# Patient Record
Sex: Female | Born: 1990 | Race: White | Hispanic: No | Marital: Married | Smoking: Never smoker
Health system: Southern US, Community
[De-identification: ages and names within clinical notes are randomized; demographics above are authoritative.]

## PROBLEM LIST (undated history)

## (undated) ENCOUNTER — Inpatient Hospital Stay (HOSPITAL_COMMUNITY): Payer: Self-pay

## (undated) DIAGNOSIS — R519 Headache, unspecified: Secondary | ICD-10-CM

## (undated) DIAGNOSIS — R51 Headache: Secondary | ICD-10-CM

## (undated) DIAGNOSIS — I499 Cardiac arrhythmia, unspecified: Secondary | ICD-10-CM

## (undated) DIAGNOSIS — G510 Bell's palsy: Secondary | ICD-10-CM

## (undated) DIAGNOSIS — F419 Anxiety disorder, unspecified: Secondary | ICD-10-CM

## (undated) DIAGNOSIS — I341 Nonrheumatic mitral (valve) prolapse: Secondary | ICD-10-CM

## (undated) DIAGNOSIS — O139 Gestational [pregnancy-induced] hypertension without significant proteinuria, unspecified trimester: Secondary | ICD-10-CM

## (undated) HISTORY — PX: CHOLECYSTECTOMY: SHX55

## (undated) HISTORY — DX: Bell's palsy: G51.0

## (undated) HISTORY — PX: LAPAROSCOPIC GASTRIC SLEEVE RESECTION: SHX5895

## (undated) HISTORY — PX: TONSILLECTOMY AND ADENOIDECTOMY: SUR1326

## (undated) HISTORY — DX: Anxiety disorder, unspecified: F41.9

## (undated) HISTORY — DX: Nonrheumatic mitral (valve) prolapse: I34.1

## (undated) NOTE — *Deleted (*Deleted)
We will continue Ajovy and Nurtec.   Migraine with aura: There is increased risk for stroke in women with migraine with aura and a contraindication for the combined contraceptive pill for use by women who have migraine with aura. The risk for women with migraine without aura is lower. However other risk factors like smoking are far more likely to increase stroke risk than migraine. There is a recommendation for no smoking and for the use of OCPs without estrogen such as progestogen only pills particularly for women with migraine with aura.Marland Kitchen People who have migraine headaches with auras may be 3 times more likely to have a stroke caused by a blood clot, compared to migraine patients who don't see auras. Women who take hormone-replacement therapy may be 30 percent more likely to suffer a clot-based stroke than women not taking medication containing estrogen. Other risk factors like smoking and high blood pressure may be  much more important.   Magnesium: may help with headaches are aura, the best evidence for magnesium is for migraine with aura is its thought to stop the cortical spreading depression we believe is the pathophysiology of migraine aura.  To prevent or relieve headaches, try the following:  Cool Compress. Lie down and place a cool compress on your head.   Avoid headache triggers. If certain foods or odors seem to have triggered your migraines in the past, avoid them. A headache diary might help you identify triggers.   Include physical activity in your daily routine. Try a daily walk or other moderate aerobic exercise.   Manage stress. Find healthy ways to cope with the stressors, such as delegating tasks on your to-do list.   Practice relaxation techniques. Try deep breathing, yoga, massage and visualization.   Eat regularly. Eating regularly scheduled meals and maintaining a healthy diet might help prevent headaches. Also, drink plenty of fluids.   Follow a regular sleep schedule.  Sleep deprivation might contribute to headaches  Consider biofeedback. With this mind-body technique, you learn to control certain bodily functions - such as muscle tension, heart rate and blood pressure - to prevent headaches or reduce headache pain.      Proceed to emergency room if you experience new or worsening symptoms or symptoms do not resolve, if you have new neurologic symptoms or if headache is severe, or for any concerning symptom.   Migraine Headache A migraine headache is a very strong throbbing pain on one side or both sides of your head. This type of headache can also cause other symptoms. It can last from 4 hours to 3 days. Talk with your doctor about what things may bring on (trigger) this condition. What are the causes? The exact cause of this condition is not known. This condition may be triggered or caused by:  Drinking alcohol.  Smoking.  Taking medicines, such as: ? Medicine used to treat chest pain (nitroglycerin). ? Birth control pills. ? Estrogen. ? Some blood pressure medicines.  Eating or drinking certain products.  Doing physical activity. Other things that may trigger a migraine headache include:  Having a menstrual period.  Pregnancy.  Hunger.  Stress.  Not getting enough sleep or getting too much sleep.  Weather changes.  Tiredness (fatigue). What increases the risk?  Being 29-89 years old.  Being female.  Having a family history of migraine headaches.  Being Caucasian.  Having depression or anxiety.  Being very overweight. What are the signs or symptoms?  A throbbing pain. This pain may: ? Happen in  any area of the head, such as on one side or both sides. ? Make it hard to do daily activities. ? Get worse with physical activity. ? Get worse around bright lights or loud noises.  Other symptoms may include: ? Feeling sick to your stomach (nauseous). ? Vomiting. ? Dizziness. ? Being sensitive to bright lights, loud  noises, or smells.  Before you get a migraine headache, you may get warning signs (an aura). An aura may include: ? Seeing flashing lights or having blind spots. ? Seeing bright spots, halos, or zigzag lines. ? Having tunnel vision or blurred vision. ? Having numbness or a tingling feeling. ? Having trouble talking. ? Having weak muscles.  Some people have symptoms after a migraine headache (postdromal phase), such as: ? Tiredness. ? Trouble thinking (concentrating). How is this treated?  Taking medicines that: ? Relieve pain. ? Relieve the feeling of being sick to your stomach. ? Prevent migraine headaches.  Treatment may also include: ? Having acupuncture. ? Avoiding foods that bring on migraine headaches. ? Learning ways to control your body functions (biofeedback). ? Therapy to help you know and deal with negative thoughts (cognitive behavioral therapy). Follow these instructions at home: Medicines  Take over-the-counter and prescription medicines only as told by your doctor.  Ask your doctor if the medicine prescribed to you: ? Requires you to avoid driving or using heavy machinery. ? Can cause trouble pooping (constipation). You may need to take these steps to prevent or treat trouble pooping:  Drink enough fluid to keep your pee (urine) pale yellow.  Take over-the-counter or prescription medicines.  Eat foods that are high in fiber. These include beans, whole grains, and fresh fruits and vegetables.  Limit foods that are high in fat and sugar. These include fried or sweet foods. Lifestyle  Do not drink alcohol.  Do not use any products that contain nicotine or tobacco, such as cigarettes, e-cigarettes, and chewing tobacco. If you need help quitting, ask your doctor.  Get at least 8 hours of sleep every night.  Limit and deal with stress. General instructions      Keep a journal to find out what may bring on your migraine headaches. For example, write  down: ? What you eat and drink. ? How much sleep you get. ? Any change in what you eat or drink. ? Any change in your medicines.  If you have a migraine headache: ? Avoid things that make your symptoms worse, such as bright lights. ? It may help to lie down in a dark, quiet room. ? Do not drive or use heavy machinery. ? Ask your doctor what activities are safe for you.  Keep all follow-up visits as told by your doctor. This is important. Contact a doctor if:  You get a migraine headache that is different or worse than others you have had.  You have more than 15 headache days in one month. Get help right away if:  Your migraine headache gets very bad.  Your migraine headache lasts longer than 72 hours.  You have a fever.  You have a stiff neck.  You have trouble seeing.  Your muscles feel weak or like you cannot control them.  You start to lose your balance a lot.  You start to have trouble walking.  You pass out (faint).  You have a seizure. Summary  A migraine headache is a very strong throbbing pain on one side or both sides of your head. These headaches can  also cause other symptoms.  This condition may be treated with medicines and changes to your lifestyle.  Keep a journal to find out what may bring on your migraine headaches.  Contact a doctor if you get a migraine headache that is different or worse than others you have had.  Contact your doctor if you have more than 15 headache days in a month. This information is not intended to replace advice given to you by your health care provider. Make sure you discuss any questions you have with your health care provider. Document Revised: 01/20/2019 Document Reviewed: 11/10/2018 Elsevier Patient Education  2020 [employer].

---

## 2012-10-12 HISTORY — PX: WISDOM TOOTH EXTRACTION: SHX21

## 2015-03-28 LAB — OB RESULTS CONSOLE HEPATITIS B SURFACE ANTIGEN: HEP B S AG: NEGATIVE

## 2015-03-28 LAB — OB RESULTS CONSOLE ANTIBODY SCREEN: Antibody Screen: NEGATIVE

## 2015-03-28 LAB — OB RESULTS CONSOLE RUBELLA ANTIBODY, IGM: Rubella: IMMUNE

## 2015-03-28 LAB — OB RESULTS CONSOLE RPR: RPR: NONREACTIVE

## 2015-03-28 LAB — OB RESULTS CONSOLE ABO/RH: RH TYPE: POSITIVE

## 2015-03-28 LAB — OB RESULTS CONSOLE HIV ANTIBODY (ROUTINE TESTING): HIV: NONREACTIVE

## 2015-03-28 LAB — OB RESULTS CONSOLE GC/CHLAMYDIA
Chlamydia: NEGATIVE
Gonorrhea: NEGATIVE

## 2015-05-14 ENCOUNTER — Ambulatory Visit: Payer: Self-pay | Admitting: Cardiovascular Disease

## 2015-05-20 ENCOUNTER — Telehealth: Payer: Self-pay | Admitting: Cardiovascular Disease

## 2015-05-20 NOTE — Telephone Encounter (Signed)
Received records from [facility] for appointment on 05/24/15 with Dr Duke Salvia.  Records given to [facility] (medical records) for Dr Leonides Sake schedule on 05/24/15.  lp

## 2015-05-23 NOTE — Progress Notes (Signed)
Cardiology Office Note   Date:  05/24/2015   ID:  Sarah Gibson, DOB 03-25-1991, MRN [id number]  PCP:  Sarah Aden, MD  Cardiologist:   Sarah Hook, MD   Chief Complaint  Patient presents with  . New Evaluation    16 weeks and 2 days pregnant- DR Sarah Gibson, HISTORY MVP  . Hypertension    HISTORY OF HTN  . Chest Pain    TIGHTNESS ,AT REST AND ACTIVITY  . Palpitations    RANDOM      History of Present Illness: Sarah Gibson is a 24 y.o. female 75 weeks G1P0 with mitral valve prolapse who presents for an evaluation of palpitations.  Sarah Gibson i reports one month of intermittent heart racing. These episodes occur daily to every other day and multiple times throughout the day. These palpitations are associated with chest pain that shoots through her heart. Prior to being pregnant she had these episodes once a week to once per month. In the past they lasted no longer than 10 seconds, however these episodes are lasting up to 1 minute. It is associated with shortness of breath. She has nausea and general but doesn't think it's any worse with these episodes. She also denies lightheadedness or dizziness as well as diaphoresis. She drinks 32 ounces of sweet tea daily.   Sarah Gibson also reports episodes of high blood pressure since becoming pregnant. 2 weeks ago she took her blood pressure at home and it was 150/94. At the time she felt disoriented. She followed up with her OB/GYN and it was approximately 140/90. Treatment was not initiated at that time. She did undergo 24-hour urine and glucose testing that were reportedly within normal limits.  Sarah Gibson also reports generalized fatigue, dizziness not associated with her palpitations, difficulty forming thoughts and decreased concentration. She denies orthopnea or PND but has noted some mild lower extremity edema.  Past Medical History  Diagnosis Date  . Bell's palsy     AT AGE 33 YEARS  . MVP (mitral valve  prolapse)   . Anxiety     Past Surgical History  Procedure Laterality Date  . Wisdom tooth extraction  2014  . Tonsillectomy and adenoidectomy      AT AGE 49 YEARS     Current Outpatient Prescriptions  Medication Sig Dispense Refill  . butalbital-acetaminophen-caffeine (FIORICET, ESGIC) 50-325-40 MG per tablet   0  . Prenatal Vit-Fe Fumarate-FA (PRENATAL MULTIVITAMIN) TABS tablet 1 TO 2 TABLETS A DAY     No current facility-administered medications for this visit.    Allergies:   Augmentin    Social History:  The patient  reports that she has never smoked. She does not have any smokeless tobacco history on file. She reports that she does not drink alcohol or use illicit drugs.   Family History:  The patient's family history includes Diabetes in her mother; Hypertension in her father and mother.    ROS:  Please see the history of present illness.   Otherwise, review of systems are positive for nasuea, fatigue.   All other systems are reviewed and negative.    PHYSICAL EXAM: VS:  BP 130/80 mmHg  Pulse 100  Ht  (1.702 m)  Wt 137.349 kg (302 lb 12.8 oz)  BMI 47.41 kg/m2 , BMI Body mass index is 47.41 kg/(m^2). GENERAL:  Well appearing HEENT:  Pupils equal round and reactive, fundi not visualized, oral mucosa unremarkable NECK:  No jugular venous distention, waveform within normal limits,  carotid upstroke brisk and symmetric, no bruits, no thyromegaly LYMPHATICS:  No cervical adenopathy LUNGS:  Clear to auscultation bilaterally HEART:  RRR.  PMI not displaced or sustained,S1 within normal limits, prominent S2 no S3, no S4, no clicks, no rubs, no murmurs ABD:  Flat, positive bowel sounds normal in frequency in pitch, no bruits, no rebound, no guarding, no midline pulsatile mass, no hepatomegaly, no splenomegaly EXT:  2 plus pulses throughout, no edema, no cyanosis no clubbing SKIN:  No rashes no nodules NEURO:  Cranial nerves II through XII grossly intact, motor grossly  intact throughout PSYCH:  Cognitively intact, oriented to person place and time    EKG:  EKG is ordered today. The ekg ordered today demonstrates sinus rhythm at 100 bpm.   Recent Labs: No results found for requested labs within last 365 days.    Lipid Panel No results found for: CHOL, TRIG, HDL, CHOLHDL, VLDL, LDLCALC, LDLDIRECT    Wt Readings from Last 3 Encounters:  05/24/15 137.349 kg (302 lb 12.8 oz)      Other studies Reviewed:Additional studies/ records that were reviewed today include: medical record. Review of the above records demonstrates:  Please see elsewhere in the note.     ASSESSMENT AND PLAN:  # Mitral valve prolapse: I do not appreciate a murmur or click of mitral valve prolapse on her exam.  Although she has symptoms such as SOB that could be from MVP and mitral regurgitation, she does not appear to be in heart failure on exam.  She certainly could be having palpitations that contribute to her symptoms. We will obtain a baseline echo today to assess for the severity of her MVP and determine if there are any regurgitant lesions.  We will also obtain a Holter monitor to determine if there are any arrhythmias contributing to her symptoms.  We recommended that she limit her caffeine intake, as this can contribute to her symptoms. - TTE - 48H Holter  #  Hypertension: BP has been borderline elevated with her OB appointments.  It is not high enough to require treatment at this time but should certainly be watched, as I would expect her BP to be lower at this phase of pregnancy. - continue to monitor for now.  BP wnl today and was reportedly normal by the time she followed up with her OB/GYN.  Current medicines are reviewed at length with the patient today.  The patient does not have concerns regarding medicines.  The following changes have been made:  no change  Labs/ tests ordered today include: holter monitor, echo  Orders Placed This Encounter  Procedures  .  Holter monitor - 48 hour  . EKG 12-Lead  . ECHOCARDIOGRAM COMPLETE     Disposition:   FU with Dr. Elmarie Shiley C. Duke Gibson in 2 months   Signed, Sarah Hook, MD  05/24/2015 5:22 PM    [facility]

## 2015-05-24 ENCOUNTER — Encounter: Payer: Self-pay | Admitting: Cardiovascular Disease

## 2015-05-24 ENCOUNTER — Encounter (INDEPENDENT_AMBULATORY_CARE_PROVIDER_SITE_OTHER): Payer: BLUE CROSS/BLUE SHIELD

## 2015-05-24 ENCOUNTER — Ambulatory Visit (INDEPENDENT_AMBULATORY_CARE_PROVIDER_SITE_OTHER): Payer: BLUE CROSS/BLUE SHIELD | Admitting: Cardiovascular Disease

## 2015-05-24 VITALS — BP 130/80 | HR 100 | Ht 67.0 in | Wt 302.8 lb

## 2015-05-24 DIAGNOSIS — I341 Nonrheumatic mitral (valve) prolapse: Secondary | ICD-10-CM

## 2015-05-24 DIAGNOSIS — Z331 Pregnant state, incidental: Secondary | ICD-10-CM | POA: Diagnosis not present

## 2015-05-24 DIAGNOSIS — Z3490 Encounter for supervision of normal pregnancy, unspecified, unspecified trimester: Secondary | ICD-10-CM

## 2015-05-24 DIAGNOSIS — R002 Palpitations: Secondary | ICD-10-CM

## 2015-05-24 NOTE — Patient Instructions (Signed)
Your physician has recommended that you wear a holter monitor FOR 48 HOURS. Holter monitors are medical devices that record the heart's electrical activity. Doctors most often use these monitors to diagnose arrhythmias. Arrhythmias are problems with the speed or rhythm of the heartbeat. The monitor is a small, portable device. You can wear one while you do your normal daily activities. This is usually used to diagnose what is causing palpitations/syncope (passing out).  Your physician has requested that you have an echocardiogram AT [address]. Echocardiography is a painless test that uses sound waves to create images of your heart. It provides your doctor with information about the size and shape of your heart and how well your heart's chambers and valves are working. This procedure takes approximately one hour. There are no restrictions for this procedure.   Your physician recommends that you schedule a follow-up appointment in 2 MONTHS DR [place].

## 2015-05-29 ENCOUNTER — Ambulatory Visit (HOSPITAL_COMMUNITY): Payer: BLUE CROSS/BLUE SHIELD | Attending: Cardiovascular Disease

## 2015-05-29 ENCOUNTER — Other Ambulatory Visit: Payer: Self-pay

## 2015-05-29 DIAGNOSIS — Z8249 Family history of ischemic heart disease and other diseases of the circulatory system: Secondary | ICD-10-CM | POA: Insufficient documentation

## 2015-05-29 DIAGNOSIS — I341 Nonrheumatic mitral (valve) prolapse: Secondary | ICD-10-CM

## 2015-05-29 DIAGNOSIS — I10 Essential (primary) hypertension: Secondary | ICD-10-CM | POA: Diagnosis not present

## 2015-05-29 DIAGNOSIS — Z3490 Encounter for supervision of normal pregnancy, unspecified, unspecified trimester: Secondary | ICD-10-CM

## 2015-05-29 DIAGNOSIS — Z331 Pregnant state, incidental: Secondary | ICD-10-CM

## 2015-05-29 DIAGNOSIS — R002 Palpitations: Secondary | ICD-10-CM

## 2015-06-03 ENCOUNTER — Telehealth: Payer: Self-pay | Admitting: *Deleted

## 2015-06-03 MED ORDER — LABETALOL HCL 100 MG PO TABS: 100.0000 mg | ORAL_TABLET | Freq: Two times a day (BID) | ORAL | Status: DC

## 2015-06-03 NOTE — Telephone Encounter (Signed)
pt aware of results  She does want to start labetalol New script sent to the pharmacy

## 2015-06-03 NOTE — Telephone Encounter (Signed)
-----   Message from Chilton Si, MD sent at 06/01/2015 11:34 AM EDT ----- Holter showed early beats from both the top and bottom chambers of the heart.  It also showed some skipped beats.  None of the findings are dangerous but could certainly be causing her palpitations.  If she wants to take something to limit the palpitations I can prescribe labetalol  bid, which will help the palpitations and lower her BP, which has been borderline.  This medication is safe in pregnancy.  Otherwise, if the symptoms are not very bothersome to her, she can continue without taking any medication.

## 2015-06-04 NOTE — Telephone Encounter (Signed)
ROUTED INFORMATION TO DR Billy Coast

## 2015-07-23 NOTE — Progress Notes (Addendum)
Cardiology Office Note   Date:  07/24/2015   ID:  Sarah Gibson, DOB October 14, 1990, MRN [id number]  PCP:  Lenoard Aden, MD  Cardiologist:   Madilyn Hook, MD   Chief Complaint  Patient presents with  . New Evaluation    SOB on minimal exertion//slight swelling in feet and ankles      History of Present Illness: Sarah Gibson is a 24 y.o. female 16 weeks G1P0 with mitral valve prolapse who presents for follow up on palpitations.  She was seen in clinic on 05/23/15 and reported an increase in palpitations.  She was referred for echo and 48 hour Holter to evaluate her report of MVP and palpitations.  Her echo was normal and showed no evidence of mitral valve prolapse or regurgitation.  Holter revealed occasional PACs and PVCs, but no sustained arrhythmias.  Ms. Strand is now [redacted] weeks pregnant and knows that she is expecting a baby girl.  She has been doing well.  For the two weeks after her appointment she noted less frequent but more intense palpitations.  Since then it has been very rare.  She notes shortness of breath when she moves quickly.  She notes orthopnea but denies PND.  She also notes mild edema that has improved since she changed chairs at work.   Past Medical History  Diagnosis Date  . Bell's palsy     AT AGE 75 YEARS  . MVP (mitral valve prolapse)   . Anxiety     Past Surgical History  Procedure Laterality Date  . Wisdom tooth extraction  2014  . Tonsillectomy and adenoidectomy      AT AGE 75 YEARS     Current Outpatient Prescriptions  Medication Sig Dispense Refill  . butalbital-acetaminophen-caffeine (FIORICET, ESGIC) 50-325-40 MG per tablet   0  . labetalol (NORMODYNE) 100 MG tablet Take 1 tablet (100 mg total) by mouth 2 (two) times daily. 60 tablet 12  . Prenatal Vit-Fe Fumarate-FA (PRENATAL MULTIVITAMIN) TABS tablet 1 TO 2 TABLETS A DAY     No current facility-administered medications for this visit.    Allergies:   Augmentin    Social  History:  The patient  reports that she has never smoked. She does not have any smokeless tobacco history on file. She reports that she does not drink alcohol or use illicit drugs.   Family History:  The patient's family history includes Diabetes in her mother; Hypertension in her father and mother.    ROS:  Please see the history of present illness.   Otherwise, review of systems are positive for nasuea, fatigue.   All other systems are reviewed and negative.    PHYSICAL EXAM: VS:  BP 130/82 mmHg  Pulse 80  Ht  (1.702 m)  Wt 137.667 kg (303 lb 8 oz)  BMI 47.52 kg/m2 , BMI Body mass index is 47.52 kg/(m^2). GENERAL:  Well appearing HEENT:  Pupils equal round and reactive, fundi not visualized, oral mucosa unremarkable NECK:  No jugular venous distention, waveform within normal limits, carotid upstroke brisk and symmetric, no bruits, no thyromegaly LYMPHATICS:  No cervical adenopathy LUNGS:  Clear to auscultation bilaterally HEART:  RRR.  PMI not displaced or sustained,S1 within normal limits, prominent S2 no S3, no S4, no clicks, no rubs, no murmurs ABD:  Flat, positive bowel sounds normal in frequency in pitch, no bruits, no rebound, no guarding, no midline pulsatile mass, no hepatomegaly, no splenomegaly EXT:  2 plus pulses throughout, no edema, no  cyanosis no clubbing SKIN:  No rashes no nodules NEURO:  Cranial nerves II through XII grossly intact, motor grossly intact throughout PSYCH:  Cognitively intact, oriented to person place and time    EKG:  EKG is not ordered today.  Echo 05/29/15: Study Conclusions  - Left ventricle: The cavity size was normal. Systolic function was normal. The estimated ejection fraction was in the range of 55% to 60%. Wall motion was normal; there were no regional wall motion abnormalities. Left ventricular diastolic function parameters were normal.  48 Hour Holter Monitor 05/24/15  Quality: Fair. Baseline artifact. Predominant  rhythm: sinus Average heart rate: 99 bpm Max heart rate: 167 bpm Min heart rate: 60 bpm Pauses >2.5 seconds: 0 Ventricular ectopics: 10 (10 isolated, 0 bigemeny, 0 trigemeny, 0 quadrigeminy events)  Percentage of ectopic beats: <0.01% Morphology: monomorphic Supraventricular ectopics: 5 3 episodes of Mobitz I Patient did not submit a symptom diary  Recent Labs: No results found for requested labs within last 365 days.   03/28/15: WBC 12.6, Hgb 13.3, Hct 38.6, Plt 341 Na 137, K 3.8, BUN 5,Creatinine 0.56, AST 20, ALT 22 Hgb A1c 5.5  Lipid Panel No results found for: CHOL, TRIG, HDL, CHOLHDL, VLDL, LDLCALC, LDLDIRECT    Wt Readings from Last 3 Encounters:  07/24/15 137.667 kg (303 lb 8 oz)  05/24/15 137.349 kg (302 lb 12.8 oz)      Other studies Reviewed:Additional studies/ records that were reviewed today include: medical record. Review of the above records demonstrates:  Please see elsewhere in the note.     ASSESSMENT AND PLAN:  # Mitral valve prolapse: Not seen on echo  # Palpitations: Holter monitor revealed occasional PVCs and PACs that likely account for her symptoms.  There were no malignant arrhythmias and her symptoms have improved.  #  Hypertension: BP has been borderline elevated with her OB appointments.  Well-controlled on labetalol.  Current medicines are reviewed at length with the patient today.  The patient does not have concerns regarding medicines.  The following changes have been made:  no change  Labs/ tests ordered today include:  No orders of the defined types were placed in this encounter.     Disposition:   FU with Dr. Elmarie Shiley C. Duke Salvia as needed  Mikael Spray, MD  07/24/2015 3:50 PM    Sarah Gibson

## 2015-07-24 ENCOUNTER — Ambulatory Visit (INDEPENDENT_AMBULATORY_CARE_PROVIDER_SITE_OTHER): Payer: BLUE CROSS/BLUE SHIELD | Admitting: Cardiovascular Disease

## 2015-07-24 VITALS — BP 130/82 | HR 80 | Ht 67.0 in | Wt 303.5 lb

## 2015-07-24 DIAGNOSIS — I10 Essential (primary) hypertension: Secondary | ICD-10-CM | POA: Diagnosis not present

## 2015-07-24 DIAGNOSIS — I493 Ventricular premature depolarization: Secondary | ICD-10-CM | POA: Diagnosis not present

## 2015-07-24 DIAGNOSIS — I491 Atrial premature depolarization: Secondary | ICD-10-CM

## 2015-07-24 NOTE — Patient Instructions (Signed)
Your physician recommends that you schedule a follow-up appointment only if needed.

## 2015-07-25 ENCOUNTER — Encounter: Payer: Self-pay | Admitting: Cardiovascular Disease

## 2015-08-14 ENCOUNTER — Encounter: Payer: Self-pay | Admitting: Cardiovascular Disease

## 2015-10-02 LAB — OB RESULTS CONSOLE GBS: STREP GROUP B AG: NEGATIVE

## 2015-10-28 ENCOUNTER — Other Ambulatory Visit: Payer: Self-pay | Admitting: Obstetrics and Gynecology

## 2015-10-29 ENCOUNTER — Inpatient Hospital Stay (HOSPITAL_COMMUNITY)
Admission: AD | Admit: 2015-10-29 | Discharge: 2015-10-30 | Disposition: A | Discharge status: Home / Self Care | Payer: BLUE CROSS/BLUE SHIELD | Source: Ambulatory Visit | Attending: Obstetrics | Admitting: Obstetrics

## 2015-10-29 ENCOUNTER — Encounter (HOSPITAL_COMMUNITY): Payer: Self-pay | Admitting: *Deleted

## 2015-10-29 DIAGNOSIS — O26893 Other specified pregnancy related conditions, third trimester: Secondary | ICD-10-CM | POA: Insufficient documentation

## 2015-10-29 DIAGNOSIS — Z3A38 38 weeks gestation of pregnancy: Secondary | ICD-10-CM | POA: Diagnosis not present

## 2015-10-29 DIAGNOSIS — G44209 Tension-type headache, unspecified, not intractable: Secondary | ICD-10-CM | POA: Diagnosis not present

## 2015-10-29 DIAGNOSIS — O163 Unspecified maternal hypertension, third trimester: Secondary | ICD-10-CM | POA: Insufficient documentation

## 2015-10-29 HISTORY — DX: Cardiac arrhythmia, unspecified: I49.9

## 2015-10-29 HISTORY — DX: Gestational (pregnancy-induced) hypertension without significant proteinuria, unspecified trimester: O13.9

## 2015-10-29 LAB — COMPREHENSIVE METABOLIC PANEL
ALT: 33 U/L (ref 14–54)
AST: 33 U/L (ref 15–41)
Albumin: 2.9 g/dL — ABNORMAL LOW (ref 3.5–5.0)
Alkaline Phosphatase: 114 U/L (ref 38–126)
Anion gap: 8 (ref 5–15)
BUN: 9 mg/dL (ref 6–20)
CO2: 23 mmol/L (ref 22–32)
Calcium: 9.7 mg/dL (ref 8.9–10.3)
Chloride: 106 mmol/L (ref 101–111)
Creatinine, Ser: 0.54 mg/dL (ref 0.44–1.00)
GFR calc Af Amer: >60 mL/min (ref >60)
GFR calc non Af Amer: >60 mL/min (ref >60)
Glucose, Bld: 114 mg/dL — ABNORMAL HIGH (ref 65–99)
Potassium: 4.2 mmol/L (ref 3.5–5.1)
Sodium: 137 mmol/L (ref 135–145)
Total Bilirubin: 0.5 mg/dL (ref 0.3–1.2)
Total Protein: 6.6 g/dL (ref 6.5–8.1)

## 2015-10-29 LAB — URINALYSIS, ROUTINE W REFLEX MICROSCOPIC
Bilirubin Urine: NEGATIVE
Glucose, UA: NEGATIVE mg/dL
Hgb urine dipstick: NEGATIVE
Ketones, ur: NEGATIVE mg/dL
Leukocytes, UA: NEGATIVE
Nitrite: NEGATIVE
Protein, ur: NEGATIVE mg/dL
Specific Gravity, Urine: 1.025 (ref 1.005–1.030)
pH: 6 (ref 5.0–8.0)

## 2015-10-29 LAB — CBC
HCT: 37.5 % (ref 36.0–46.0)
Hemoglobin: 13.5 g/dL (ref 12.0–15.0)
MCH: 29.8 pg (ref 26.0–34.0)
MCHC: 36 g/dL (ref 30.0–36.0)
MCV: 82.8 fL (ref 78.0–100.0)
Platelets: 259 10*3/uL (ref 150–400)
RBC: 4.53 MIL/uL (ref 3.87–5.11)
RDW: 13.4 % (ref 11.5–15.5)
WBC: 12.3 10*3/uL — ABNORMAL HIGH (ref 4.0–10.5)

## 2015-10-29 MED ORDER — BUTALBITAL-APAP-CAFFEINE 50-325-40 MG PO TABS
1.0000 | ORAL_TABLET | Freq: Once | ORAL | Status: AC
  Administered 2015-10-29: 1 via ORAL
  Filled 2015-10-29: qty 1

## 2015-10-29 NOTE — MAU Provider Note (Signed)
History     CSN: [id number]  Arrival date and time: 10/29/15 2153   No chief complaint on file.  HPI  Reports headache all day - tension-like in front of head and neck feels tight Elevated BP - states "high" when she went to local fire-station to get BP check  Past Medical History  Diagnosis Date  . Bell's palsy     AT AGE 25 YEARS  . MVP (mitral valve prolapse)   . Anxiety   . Pregnancy induced hypertension   . Arrhythmia     Past Surgical History  Procedure Laterality Date  . Wisdom tooth extraction  2014  . Tonsillectomy and adenoidectomy      AT AGE 78 YEARS    Family History  Problem Relation Age of Onset  . Hypertension Mother   . Diabetes Mother   . Hypertension Father     Social History  Substance Use Topics  . Smoking status: Never Smoker   . Smokeless tobacco: None  . Alcohol Use: No    Allergies:  Allergies  Allergen Reactions  . Augmentin [Amoxicillin-Pot Clavulanate] Nausea Only    Prescriptions prior to admission  Medication Sig Dispense Refill Last Dose  . acetaminophen (TYLENOL) 500 MG tablet Take 500 mg by mouth every 6 (six) hours as needed.   10/29/2015 at 1800  . labetalol (NORMODYNE) 100 MG tablet Take 1 tablet (100 mg total) by mouth 2 (two) times daily. 60 tablet 12 10/29/2015 at 2100  . Prenatal Vit-Fe Fumarate-FA (PRENATAL MULTIVITAMIN) TABS tablet 1 TO 2 TABLETS A DAY   10/28/2015 at Unknown time  . ranitidine (ZANTAC) 150 MG tablet Take 150 mg by mouth 2 (two) times daily.   10/29/2015 at Unknown time  . butalbital-acetaminophen-caffeine (FIORICET, ESGIC) 50-325-40 MG per tablet   0 Taking    ROS  Headache No nausea or vomiting / no epigastric pain No blurred vision  Physical Exam   Blood pressure 133/86, pulse 92, temperature 98.2 F (36.8 C), temperature source Oral, resp. rate 20, height  (1.676 m), weight 139.424 kg (307 lb 6 oz), SpO2 98 %.   BP: 126/79 - 133/86 - 127/83  Physical Exam  Alert and  oriented Abdomen soft / gravid with non-tender uterus Extremities trace pedal edema  Results for Sarah, Gibson (MRN [id number]) as of 10/29/2015 23:46  Ref. Range 10/29/2015 20:08 10/29/2015 22:49  WBC Latest Ref Range: 4.0-10.5 K/uL  12.3 (H)  RBC Latest Ref Range: 3.87-5.11 MIL/uL  4.53  Hemoglobin Latest Ref Range: 12.0-15.0 g/dL  95.2  HCT Latest Ref Range: 36.0-46.0 %  37.5  MCV Latest Ref Range: 78.0-100.0 fL  82.8  MCH Latest Ref Range: 26.0-34.0 pg  29.8  MCHC Latest Ref Range: 30.0-36.0 g/dL  84.1  RDW Latest Ref Range: 11.5-15.5 %  13.4  Platelets Latest Ref Range: 150-400 K/uL  259  Appearance Latest Ref Range: CLEAR  HAZY (A)   Bilirubin Urine Latest Ref Range: NEGATIVE  NEGATIVE   Color, Urine Latest Ref Range: YELLOW  YELLOW   Glucose Latest Ref Range: NEGATIVE mg/dL NEGATIVE   Hgb urine dipstick Latest Ref Range: NEGATIVE  NEGATIVE   Ketones, ur Latest Ref Range: NEGATIVE mg/dL NEGATIVE   Leukocytes, UA Latest Ref Range: NEGATIVE  NEGATIVE   Nitrite Latest Ref Range: NEGATIVE  NEGATIVE   pH Latest Ref Range: 5.0-8.0  6.0   Protein Latest Ref Range: NEGATIVE mg/dL NEGATIVE   Specific Gravity, Urine Latest Ref Range: 1.005-1.030  1.025  Ref Range 10:49 PM    Sodium 135 - 145 mmol/L 137   Potassium 3.5 - 5.1 mmol/L 4.2   Chloride 101 - 111 mmol/L 106   CO2 22 - 32 mmol/L 23   Glucose, Bld 65 - 99 mg/dL 161 (H)   BUN 6 - 20 mg/dL 9   Creatinine, Ser 0.96 - 1.00 mg/dL 0.45   Calcium 8.9 - 40.9 mg/dL 9.7   Total Protein 6.5 - 8.1 g/dL 6.6   Albumin 3.5 - 5.0 g/dL 2.9 (L)   AST 15 - 41 U/L 33   ALT 14 - 54 U/L 33   Alkaline Phosphatase 38 - 126 U/L 114   Total Bilirubin 0.3 - 1.2 mg/dL 0.5   GFR calc non Af Amer >60 mL/min >60   GFR calc Af Amer >60 mL/min >60          MAU Course  Procedures NST - baseline 135 / moderate variability / + accels / no decels  Assessment and Plan  38.6 weeks Headache Hypertension - no evidence of  pre-eclampsia  1) DC home 2) increase water intake 3) regular dose of Labetalol Q 12 hours - do not skip doses 4) BP check at home with only large cuff for accurate reading 5) PIH precautions - call office with symptoms  Sarah Gibson 10/29/2015, 11:46 PM

## 2015-10-29 NOTE — MAU Note (Addendum)
PT SAYS  SHE HAS HAD A TENSION H/A   ALL DAY   STARTED  TODAY  AT  1 PM-  TOOK TYLENOL   2  -5OO MG  TAB  AT  6PM.  -   NO RELIEF.      HAD  H/A ALSO ON    YESTERDAY -  SHE SLEPT  IT  OFF.        AT 845PM-  SHE  WENT  TO   FD  IN [place]   AND  HAD  BP  CHECKED -   147/110.   -   HAD NOT HAD HER EVENING   DOSE OF BP MED  YET       SHE    TOOK BP MED   AT 9PM.   .  H/A  STILL SAME  AND   FEELS  TENSION IN HER  NECK   .       IS  AN INDUCTION   ON  1-25      SHE  CALLED  TANYA  , CNM -  TOLD  TO COME  HERE.

## 2015-10-31 ENCOUNTER — Telehealth (HOSPITAL_COMMUNITY): Payer: Self-pay | Admitting: *Deleted

## 2015-10-31 ENCOUNTER — Encounter (HOSPITAL_COMMUNITY): Payer: Self-pay | Admitting: *Deleted

## 2015-10-31 NOTE — Telephone Encounter (Signed)
Preadmission screen  

## 2015-11-05 ENCOUNTER — Other Ambulatory Visit: Payer: Self-pay | Admitting: Obstetrics and Gynecology

## 2015-11-05 NOTE — H&P (Signed)
Sarah Gibson is a 25 y.o. female presenting for gestational HTN.  Maternal Medical History:  Reason for admission: Contractions.  Nausea.  Contractions: Frequency: rare.   Perceived severity is mild.    Fetal activity: Perceived fetal activity is normal.   Last perceived fetal movement was within the past hour.    Prenatal complications: PIH.   Prenatal Complications - Diabetes: none.    OB History    Gravida Para Term Preterm AB TAB SAB Ectopic Multiple Living   1              Past Medical History  Diagnosis Date  . Bell's palsy     AT AGE 89 YEARS  . MVP (mitral valve prolapse)   . Anxiety   . Pregnancy induced hypertension   . Arrhythmia    Past Surgical History  Procedure Laterality Date  . Wisdom tooth extraction  2014  . Tonsillectomy and adenoidectomy      AT AGE 15 YEARS   Family History: family history includes Depression in her mother; Diabetes in her mother; Hypertension in her father and mother; Migraines in her father. Social History:  reports that she has never smoked. She has never used smokeless tobacco. She reports that she does not drink alcohol or use illicit drugs.   Prenatal Transfer Tool  Maternal Diabetes: No Genetic Screening: Normal Maternal Ultrasounds/Referrals: Normal Fetal Ultrasounds or other Referrals:  None Maternal Substance Abuse:  No Significant Maternal Medications:  Meds include: Other:  Significant Maternal Lab Results:  None Other Comments:  None  Review of Systems  Constitutional: Negative.   Eyes: Negative for blurred vision and photophobia.  Respiratory: Negative.   Cardiovascular: Negative for chest pain.  Gastrointestinal: Negative for heartburn and nausea.  Genitourinary: Negative.   Musculoskeletal: Negative.   Skin: Negative.   Neurological: Negative.  Negative for headaches.      There were no vitals taken for this visit. Exam Physical Exam  Nursing note and vitals reviewed. Constitutional: She is  oriented to person, place, and time. She appears well-developed and well-nourished.  HENT:  Head: Normocephalic.  Neck: Normal range of motion. Neck supple.  Cardiovascular: Normal rate and regular rhythm.   Respiratory: Effort normal and breath sounds normal.  GI: Soft. Bowel sounds are normal.  Genitourinary: Vagina normal and uterus normal.  Musculoskeletal: Normal range of motion.  Neurological: She is alert and oriented to person, place, and time. She has normal reflexes.  Skin: Skin is warm and dry.  Psychiatric: She has a normal mood and affect.    Prenatal labs: ABO, Rh: O/Positive/-- (06/16 0000) Antibody: Negative (06/16 0000) Rubella: Immune (06/16 0000) RPR: Nonreactive (06/16 0000)  HBsAg: Negative (06/16 0000)  HIV: Non-reactive (06/16 0000)  GBS: Negative (12/21 0000)   Assessment/Plan: Gestational HTN Declined IOL until 40 weeks Admit   Carston Riedl J 11/05/2015, 9:43 PM

## 2015-11-06 ENCOUNTER — Inpatient Hospital Stay (HOSPITAL_COMMUNITY): Payer: BLUE CROSS/BLUE SHIELD | Admitting: Anesthesiology

## 2015-11-06 ENCOUNTER — Inpatient Hospital Stay (HOSPITAL_COMMUNITY)
Admission: RE | Admit: 2015-11-06 | Discharge: 2015-11-09 | DRG: 765 | Disposition: A | Discharge status: Home / Self Care | Payer: BLUE CROSS/BLUE SHIELD | Source: Ambulatory Visit | Attending: Obstetrics and Gynecology | Admitting: Obstetrics and Gynecology

## 2015-11-06 ENCOUNTER — Encounter (HOSPITAL_COMMUNITY): Payer: Self-pay

## 2015-11-06 DIAGNOSIS — O99214 Obesity complicating childbirth: Secondary | ICD-10-CM | POA: Diagnosis present

## 2015-11-06 DIAGNOSIS — Z833 Family history of diabetes mellitus: Secondary | ICD-10-CM

## 2015-11-06 DIAGNOSIS — Z3A40 40 weeks gestation of pregnancy: Secondary | ICD-10-CM

## 2015-11-06 DIAGNOSIS — O169 Unspecified maternal hypertension, unspecified trimester: Secondary | ICD-10-CM | POA: Diagnosis present

## 2015-11-06 DIAGNOSIS — D62 Acute posthemorrhagic anemia: Secondary | ICD-10-CM | POA: Diagnosis not present

## 2015-11-06 DIAGNOSIS — I341 Nonrheumatic mitral (valve) prolapse: Secondary | ICD-10-CM | POA: Diagnosis present

## 2015-11-06 DIAGNOSIS — O9942 Diseases of the circulatory system complicating childbirth: Secondary | ICD-10-CM | POA: Diagnosis present

## 2015-11-06 DIAGNOSIS — Z6841 Body Mass Index (BMI) 40.0 and over, adult: Secondary | ICD-10-CM | POA: Diagnosis not present

## 2015-11-06 DIAGNOSIS — O9081 Anemia of the puerperium: Secondary | ICD-10-CM | POA: Diagnosis not present

## 2015-11-06 DIAGNOSIS — Z8249 Family history of ischemic heart disease and other diseases of the circulatory system: Secondary | ICD-10-CM | POA: Diagnosis not present

## 2015-11-06 DIAGNOSIS — O134 Gestational [pregnancy-induced] hypertension without significant proteinuria, complicating childbirth: Secondary | ICD-10-CM | POA: Diagnosis present

## 2015-11-06 LAB — COMPREHENSIVE METABOLIC PANEL
ALK PHOS: 116 U/L (ref 38–126)
ALT: 37 U/L (ref 14–54)
ANION GAP: 11 (ref 5–15)
AST: 40 U/L (ref 15–41)
Albumin: 2.9 g/dL — ABNORMAL LOW (ref 3.5–5.0)
BILIRUBIN TOTAL: 0.6 mg/dL (ref 0.3–1.2)
BUN: 6 mg/dL (ref 6–20)
CALCIUM: 9.2 mg/dL (ref 8.9–10.3)
CO2: 18 mmol/L — ABNORMAL LOW (ref 22–32)
Chloride: 108 mmol/L (ref 101–111)
Creatinine, Ser: 0.54 mg/dL (ref 0.44–1.00)
GFR calc Af Amer: >60 mL/min (ref >60)
GLUCOSE: 97 mg/dL (ref 65–99)
POTASSIUM: 3.9 mmol/L (ref 3.5–5.1)
Sodium: 137 mmol/L (ref 135–145)
TOTAL PROTEIN: 6.4 g/dL — AB (ref 6.5–8.1)

## 2015-11-06 LAB — CBC
HCT: 38 % (ref 36.0–46.0)
HEMATOCRIT: 37.8 % (ref 36.0–46.0)
HEMOGLOBIN: 13.4 g/dL (ref 12.0–15.0)
Hemoglobin: 13.8 g/dL (ref 12.0–15.0)
MCH: 30.1 pg (ref 26.0–34.0)
MCH: 29.7 pg (ref 26.0–34.0)
MCHC: 36.3 g/dL — AB (ref 30.0–36.0)
MCHC: 35.4 g/dL (ref 30.0–36.0)
MCV: 82.8 fL (ref 78.0–100.0)
MCV: 83.8 fL (ref 78.0–100.0)
Platelets: 277 10*3/uL (ref 150–400)
Platelets: 256 10*3/uL (ref 150–400)
RBC: 4.59 MIL/uL (ref 3.87–5.11)
RBC: 4.51 MIL/uL (ref 3.87–5.11)
RDW: 13.7 % (ref 11.5–15.5)
RDW: 13.8 % (ref 11.5–15.5)
WBC: 13.7 10*3/uL — ABNORMAL HIGH (ref 4.0–10.5)
WBC: 13.5 10*3/uL — ABNORMAL HIGH (ref 4.0–10.5)

## 2015-11-06 LAB — ABO/RH: ABO/RH(D): O POS

## 2015-11-06 LAB — TYPE AND SCREEN
ABO/RH(D): O POS
Antibody Screen: NEGATIVE

## 2015-11-06 LAB — GLUCOSE, CAPILLARY: GLUCOSE-CAPILLARY: 75 mg/dL (ref 65–99)

## 2015-11-06 LAB — URIC ACID: URIC ACID, SERUM: 6.4 mg/dL (ref 2.3–6.6)

## 2015-11-06 LAB — RPR: RPR: NONREACTIVE

## 2015-11-06 LAB — LACTATE DEHYDROGENASE: LDH: 149 U/L (ref 98–192)

## 2015-11-06 MED ORDER — ACETAMINOPHEN 325 MG PO TABS: 650.0000 mg | ORAL_TABLET | ORAL | Status: DC | PRN

## 2015-11-06 MED ORDER — OXYCODONE-ACETAMINOPHEN 5-325 MG PO TABS: 2.0000 | ORAL_TABLET | ORAL | Status: DC | PRN

## 2015-11-06 MED ORDER — LIDOCAINE HCL (PF) 1 % IJ SOLN: 30.0000 mL | INTRAMUSCULAR | Status: DC | PRN

## 2015-11-06 MED ORDER — OXYTOCIN 10 UNIT/ML IJ SOLN: 1.0000 m[IU]/min | INTRAVENOUS | Status: DC

## 2015-11-06 MED ORDER — OXYTOCIN 10 UNIT/ML IJ SOLN
1.0000 m[IU]/min | INTRAVENOUS | Status: DC
  Administered 2015-11-06: 2 m[IU]/min via INTRAVENOUS
  Administered 2015-11-06: 26 m[IU]/min via INTRAVENOUS
  Filled 2015-11-06 (×2): qty 4

## 2015-11-06 MED ORDER — LACTATED RINGERS IV SOLN
500.0000 mL | INTRAVENOUS | Status: DC | PRN
  Administered 2015-11-06 – 2015-11-07 (×2): 500 mL via INTRAVENOUS

## 2015-11-06 MED ORDER — ONDANSETRON HCL 4 MG/2ML IJ SOLN
4.0000 mg | Freq: Four times a day (QID) | INTRAMUSCULAR | Status: DC | PRN
  Administered 2015-11-06 – 2015-11-07 (×2): 4 mg via INTRAVENOUS
  Filled 2015-11-06 (×2): qty 2

## 2015-11-06 MED ORDER — TERBUTALINE SULFATE 1 MG/ML IJ SOLN: 0.2500 mg | Freq: Once | INTRAMUSCULAR | Status: DC | PRN

## 2015-11-06 MED ORDER — PHENYLEPHRINE 40 MCG/ML (10ML) SYRINGE FOR IV PUSH (FOR BLOOD PRESSURE SUPPORT)
80.0000 ug | PREFILLED_SYRINGE | INTRAVENOUS | Status: DC | PRN
  Filled 2015-11-06: qty 20

## 2015-11-06 MED ORDER — LABETALOL HCL 100 MG PO TABS
100.0000 mg | ORAL_TABLET | Freq: Two times a day (BID) | ORAL | Status: DC
  Administered 2015-11-06 – 2015-11-07 (×3): 100 mg via ORAL
  Filled 2015-11-06 (×3): qty 1

## 2015-11-06 MED ORDER — LIDOCAINE HCL (PF) 1 % IJ SOLN
INTRAMUSCULAR | Status: DC | PRN
  Administered 2015-11-06 (×2): 4 mL

## 2015-11-06 MED ORDER — MISOPROSTOL 25 MCG QUARTER TABLET
25.0000 ug | ORAL_TABLET | ORAL | Status: DC | PRN
  Administered 2015-11-06: 25 ug via VAGINAL
  Filled 2015-11-06: qty 0.25

## 2015-11-06 MED ORDER — CITRIC ACID-SODIUM CITRATE 334-500 MG/5ML PO SOLN
30.0000 mL | ORAL | Status: DC | PRN
  Administered 2015-11-06 – 2015-11-07 (×2): 30 mL via ORAL
  Filled 2015-11-06 (×2): qty 15

## 2015-11-06 MED ORDER — EPHEDRINE 5 MG/ML INJ: 10.0000 mg | INTRAVENOUS | Status: DC | PRN

## 2015-11-06 MED ORDER — FLEET ENEMA 7-19 GM/118ML RE ENEM: 1.0000 | ENEMA | RECTAL | Status: DC | PRN

## 2015-11-06 MED ORDER — LACTATED RINGERS IV SOLN
INTRAVENOUS | Status: DC
  Administered 2015-11-06: 03:00:00 via INTRAVENOUS
  Administered 2015-11-06: 125 mL/h via INTRAVENOUS
  Administered 2015-11-07: 09:00:00 via INTRAVENOUS

## 2015-11-06 MED ORDER — FENTANYL 2.5 MCG/ML BUPIVACAINE 1/10 % EPIDURAL INFUSION (WH - ANES)
14.0000 mL/h | INTRAMUSCULAR | Status: DC | PRN
  Administered 2015-11-06 – 2015-11-07 (×4): 14 mL/h via EPIDURAL
  Filled 2015-11-06 (×4): qty 125

## 2015-11-06 MED ORDER — OXYCODONE-ACETAMINOPHEN 5-325 MG PO TABS: 1.0000 | ORAL_TABLET | ORAL | Status: DC | PRN

## 2015-11-06 MED ORDER — OXYTOCIN 10 UNIT/ML IJ SOLN
2.5000 [IU]/h | INTRAVENOUS | Status: DC
  Filled 2015-11-06: qty 4

## 2015-11-06 MED ORDER — DIPHENHYDRAMINE HCL 50 MG/ML IJ SOLN: 12.5000 mg | INTRAMUSCULAR | Status: DC | PRN

## 2015-11-06 MED ORDER — OXYTOCIN BOLUS FROM INFUSION: 500.0000 mL | INTRAVENOUS | Status: DC

## 2015-11-06 NOTE — Progress Notes (Signed)
Sarah Gibson is a 25 y.o. G1P0 at [redacted]w[redacted]d by LMP admitted for induction of labor due to Hypertension.  Subjective: No complaints  Objective: BP 126/69 mmHg  Pulse 87  Temp(Src) 98.1 F (36.7 C) (Oral)  Resp 20  Ht  (1.727 m)  Wt 138.347 kg (305 lb)  BMI 46.39 kg/m2  SpO2 100%   Total I/O In: -  Out: 200 [Urine:200]  FHT:  FHR: 145 bpm, variability: moderate,  accelerations:  Present,  decelerations:  Absent UC:   irregular, every 5 minutes SVE:   Dilation: 3.5 Effacement (%): 60 Station: -2 Exam by:: Dr. Billy Gibson  IUPC placed without difficulty  Labs: Lab Results  Component Value Date   WBC 13.5* 11/06/2015   HGB 13.4 11/06/2015   HCT 37.8 11/06/2015   MCV 83.8 11/06/2015   PLT 256 11/06/2015    Assessment / Plan: Induction of labor due to gestational hypertension,  progressing well on pitocin  Labor: Progressing normally Preeclampsia:  no signs or symptoms of toxicity Fetal Wellbeing:  Category I Pain Control:  Epidural I/D:  n/a Anticipated MOD:  NSVD  Sarah Gibson J 11/06/2015, 1:14 PM

## 2015-11-06 NOTE — Progress Notes (Signed)
Sarah Gibson is a 25 y.o. G1P0 at [redacted]w[redacted]d by LMP admitted for induction of labor due to Hypertension.  Subjective:   Objective: BP 136/82 mmHg  Pulse 114  Temp(Src) 98.8 F (37.1 C) (Oral)  Resp 18  Ht  (1.727 m)  Wt 138.347 kg (305 lb)  BMI 46.39 kg/m2  SpO2 100% I/O last 3 completed shifts: In: -  Out: 1600 [Urine:1600]    FHT:  FHR: 155 bpm, variability: moderate,  accelerations:  Present,  decelerations:  Absent UC:   irregular, every 6 minutes SVE:   4/80/-1/soft IUPC in place  Labs: Lab Results  Component Value Date   WBC 13.5* 11/06/2015   HGB 13.4 11/06/2015   HCT 37.8 11/06/2015   MCV 83.8 11/06/2015   PLT 256 11/06/2015    Assessment / Plan: Protracted latent phase  Labor: restart pitocin Preeclampsia:  no signs or symptoms of toxicity, intake and ouput balanced and labs stable Fetal Wellbeing:  Category I Pain Control:  Epidural I/D:  n/a Anticipated MOD:  guarded  Robinson Brinkley J 11/06/2015, 10:45 PM

## 2015-11-06 NOTE — Progress Notes (Signed)
Sarah Gibson is a 25 y.o. G1P0 at [redacted]w[redacted]d by LMP admitted for induction of labor due to Hypertension.  Subjective: Comfortable.  Objective: BP 150/77 mmHg  Pulse 101  Temp(Src) 98.5 F (36.9 C) (Oral)  Resp 18  Ht  (1.727 m)  Wt 138.347 kg (305 lb)  BMI 46.39 kg/m2  SpO2 100% I/O last 3 completed shifts: In: -  Out: 1600 [Urine:1600]    FHT:  FHR: 155 bpm, variability: moderate,  accelerations:  Present,  decelerations:  Absent UC:   irregular, every 10 minutes SVE:   Dilation: 4 Effacement (%): 80 Station: -1 Exam by:: Valentina Lucks, RN  Labs: Lab Results  Component Value Date   WBC 13.5* 11/06/2015   HGB 13.4 11/06/2015   HCT 37.8 11/06/2015   MCV 83.8 11/06/2015   PLT 256 11/06/2015    Assessment / Plan: Protracted latent phase  Labor: off pitocin Preeclampsia:  no signs or symptoms of toxicity, intake and ouput balanced and labs stable Fetal Wellbeing:  Category I Pain Control:  Epidural I/D:  n/a Anticipated MOD:  guarded  Unable to establish labor pattern after 12 hours Pitocin. BP stable. Labs stable. FHR Category 1. Will turn off pitocin and restart at 2200.  Tzion Wedel J 11/06/2015, 9:20 PM

## 2015-11-06 NOTE — Anesthesia Procedure Notes (Signed)
Epidural Patient location during procedure: OB  Staffing Anesthesiologist: Lavone Barrientes Performed by: anesthesiologist   Preanesthetic Checklist Completed: patient identified, site marked, surgical consent, pre-op evaluation, timeout performed, IV checked, risks and benefits discussed and monitors and equipment checked  Epidural Patient position: sitting Prep: site prepped and draped and DuraPrep Patient monitoring: continuous pulse ox and blood pressure Approach: midline Location: L3-L4 Injection technique: LOR saline  Needle:  Needle type: Tuohy  Needle gauge: 17 G Needle length: 9 cm and 9 Needle insertion depth: 8 cm Catheter type: closed end flexible Catheter size: 19 Gauge Catheter at skin depth: 13 cm Test dose: negative  Assessment Events: blood not aspirated, injection not painful, no injection resistance, negative IV test and no paresthesia  Additional Notes Patient identified. Risks/Benefits/Options discussed with patient including but not limited to bleeding, infection, nerve damage, paralysis, failed block, incomplete pain control, headache, blood pressure changes, nausea, vomiting, reactions to medication both or allergic, itching and postpartum back pain. Confirmed with bedside nurse the patient's most recent platelet count. Confirmed with patient that they are not currently taking any anticoagulation, have any bleeding history or any family history of bleeding disorders. Patient expressed understanding and wished to proceed. All questions were answered. Sterile technique was used throughout the entire procedure. Please see nursing notes for vital signs. Test dose was given through epidural catheter and negative prior to continuing to dose epidural or start infusion. Warning signs of high block given to the patient including shortness of breath, tingling/numbness in hands, complete motor block, or any concerning symptoms with instructions to call for help. Patient was  given instructions on fall risk and not to get out of bed. All questions and concerns addressed with instructions to call with any issues or inadequate analgesia.      

## 2015-11-06 NOTE — Anesthesia Preprocedure Evaluation (Addendum)
Anesthesia Evaluation  Patient identified by MRN, date of birth, ID band Patient awake    Reviewed: Allergy & Precautions, NPO status , Patient's Chart, lab work & pertinent test results  History of Anesthesia Complications Negative for: history of anesthetic complications  Airway Mallampati: II  TM Distance: >3 FB Neck ROM: Full    Dental no notable dental hx. (+) Dental Advisory Given   Pulmonary neg pulmonary ROS,    Pulmonary exam normal breath sounds clear to auscultation       Cardiovascular hypertension, Pt. on medications Normal cardiovascular exam+ Valvular Problems/Murmurs MVP  Rhythm:Regular Rate:Normal     Neuro/Psych PSYCHIATRIC DISORDERS Anxiety Hx of bells palsy at age 62, resolved     GI/Hepatic negative GI ROS, Neg liver ROS,   Endo/Other  negative endocrine ROS  Renal/GU negative Renal ROS  negative genitourinary   Musculoskeletal negative musculoskeletal ROS (+)   Abdominal   Peds negative pediatric ROS (+)  Hematology negative hematology ROS (+)   Anesthesia Other Findings   Reproductive/Obstetrics (+) Pregnancy                           Anesthesia Physical Anesthesia Plan  ASA: III  Anesthesia Plan: Epidural   Post-op Pain Management:    Induction:   Airway Management Planned:   Additional Equipment:   Intra-op Plan:   Post-operative Plan:   Informed Consent: I have reviewed the patients History and Physical, chart, labs and discussed the procedure including the risks, benefits and alternatives for the proposed anesthesia with the patient or authorized representative who has indicated his/her understanding and acceptance.   Dental advisory given  Plan Discussed with: CRNA and Surgeon  Anesthesia Plan Comments: (For C/S with labor epidural that has been dosed prior to OR.)      Anesthesia Quick Evaluation

## 2015-11-07 ENCOUNTER — Encounter (HOSPITAL_COMMUNITY): Payer: Self-pay

## 2015-11-07 ENCOUNTER — Encounter (HOSPITAL_COMMUNITY)
Admission: RE | Disposition: A | Discharge status: Home / Self Care | Payer: Self-pay | Source: Ambulatory Visit | Attending: Obstetrics and Gynecology

## 2015-11-07 LAB — GLUCOSE, CAPILLARY
GLUCOSE-CAPILLARY: 105 mg/dL — AB (ref 65–99)
Glucose-Capillary: 85 mg/dL (ref 65–99)
Glucose-Capillary: 114 mg/dL — ABNORMAL HIGH (ref 65–99)

## 2015-11-07 SURGERY — Surgical Case
Anesthesia: Epidural | Site: Abdomen

## 2015-11-07 MED ORDER — KETOROLAC TROMETHAMINE 30 MG/ML IJ SOLN
30.0000 mg | Freq: Four times a day (QID) | INTRAMUSCULAR | Status: AC | PRN
  Administered 2015-11-07: 30 mg via INTRAMUSCULAR

## 2015-11-07 MED ORDER — SIMETHICONE 80 MG PO CHEW
80.0000 mg | CHEWABLE_TABLET | ORAL | Status: DC | PRN
  Filled 2015-11-07: qty 1

## 2015-11-07 MED ORDER — KETOROLAC TROMETHAMINE 30 MG/ML IJ SOLN: 30.0000 mg | Freq: Once | INTRAMUSCULAR | Status: DC

## 2015-11-07 MED ORDER — HYDROMORPHONE HCL 1 MG/ML IJ SOLN
0.2500 mg | INTRAMUSCULAR | Status: DC | PRN
Start: 2015-11-07 — End: 2015-11-07

## 2015-11-07 MED ORDER — DIBUCAINE 1 % RE OINT
1.0000 | TOPICAL_OINTMENT | RECTAL | Status: DC | PRN
Start: 2015-11-07 — End: 2015-11-09
  Filled 2015-11-07: qty 28

## 2015-11-07 MED ORDER — FENTANYL CITRATE (PF) 100 MCG/2ML IJ SOLN
INTRAMUSCULAR | Status: AC
  Filled 2015-11-07: qty 2

## 2015-11-07 MED ORDER — NALBUPHINE HCL 10 MG/ML IJ SOLN: 5.0000 mg | Freq: Once | INTRAMUSCULAR | Status: DC | PRN

## 2015-11-07 MED ORDER — FENTANYL CITRATE (PF) 100 MCG/2ML IJ SOLN
INTRAMUSCULAR | Status: DC | PRN
  Administered 2015-11-07: 100 ug via INTRAVENOUS

## 2015-11-07 MED ORDER — MENTHOL 3 MG MT LOZG
1.0000 | LOZENGE | OROMUCOSAL | Status: DC | PRN
  Administered 2015-11-08: 3 mg via ORAL
  Filled 2015-11-07 (×2): qty 9

## 2015-11-07 MED ORDER — SCOPOLAMINE 1 MG/3DAYS TD PT72
MEDICATED_PATCH | TRANSDERMAL | Status: DC | PRN
  Administered 2015-11-07: 1 via TRANSDERMAL

## 2015-11-07 MED ORDER — ACETAMINOPHEN 325 MG PO TABS
650.0000 mg | ORAL_TABLET | ORAL | Status: DC | PRN
  Administered 2015-11-08: 650 mg via ORAL
  Filled 2015-11-07: qty 2

## 2015-11-07 MED ORDER — PROMETHAZINE HCL 25 MG/ML IJ SOLN
INTRAMUSCULAR | Status: DC | PRN
  Administered 2015-11-07: 6.25 mg via INTRAVENOUS

## 2015-11-07 MED ORDER — IBUPROFEN 600 MG PO TABS: 600.0000 mg | ORAL_TABLET | Freq: Four times a day (QID) | ORAL | Status: DC | PRN

## 2015-11-07 MED ORDER — MEPERIDINE HCL 25 MG/ML IJ SOLN: 6.2500 mg | INTRAMUSCULAR | Status: DC | PRN

## 2015-11-07 MED ORDER — MORPHINE SULFATE (PF) 0.5 MG/ML IJ SOLN
INTRAMUSCULAR | Status: DC | PRN
  Administered 2015-11-07: 4 mg via EPIDURAL
  Administered 2015-11-07: 1 mg via INTRAVENOUS

## 2015-11-07 MED ORDER — DEXAMETHASONE SODIUM PHOSPHATE 4 MG/ML IJ SOLN
INTRAMUSCULAR | Status: DC | PRN
  Administered 2015-11-07: 4 mg via INTRAVENOUS

## 2015-11-07 MED ORDER — PROMETHAZINE HCL 25 MG/ML IJ SOLN: 6.2500 mg | INTRAMUSCULAR | Status: DC | PRN

## 2015-11-07 MED ORDER — NALOXONE HCL 0.4 MG/ML IJ SOLN: 0.4000 mg | INTRAMUSCULAR | Status: DC | PRN

## 2015-11-07 MED ORDER — ONDANSETRON HCL 4 MG/2ML IJ SOLN
INTRAMUSCULAR | Status: AC
  Filled 2015-11-07: qty 2

## 2015-11-07 MED ORDER — OXYTOCIN 10 UNIT/ML IJ SOLN: 2.5000 [IU]/h | INTRAVENOUS | Status: AC

## 2015-11-07 MED ORDER — DIPHENHYDRAMINE HCL 25 MG PO CAPS: 25.0000 mg | ORAL_CAPSULE | Freq: Four times a day (QID) | ORAL | Status: DC | PRN

## 2015-11-07 MED ORDER — OXYTOCIN 10 UNIT/ML IJ SOLN
INTRAMUSCULAR | Status: AC
  Filled 2015-11-07: qty 4

## 2015-11-07 MED ORDER — NALBUPHINE HCL 10 MG/ML IJ SOLN: 5.0000 mg | INTRAMUSCULAR | Status: DC | PRN

## 2015-11-07 MED ORDER — METHYLERGONOVINE MALEATE 0.2 MG/ML IJ SOLN: 0.2000 mg | INTRAMUSCULAR | Status: DC | PRN

## 2015-11-07 MED ORDER — WITCH HAZEL-GLYCERIN EX PADS
1.0000 | MEDICATED_PAD | CUTANEOUS | Status: DC | PRN
Start: 2015-11-07 — End: 2015-11-09

## 2015-11-07 MED ORDER — SODIUM CHLORIDE 0.9 % IJ SOLN
INTRAMUSCULAR | Status: DC | PRN
  Administered 2015-11-07: 20 mL

## 2015-11-07 MED ORDER — ACETAMINOPHEN 500 MG PO TABS
1000.0000 mg | ORAL_TABLET | Freq: Four times a day (QID) | ORAL | Status: AC
  Administered 2015-11-07: 1000 mg via ORAL
  Filled 2015-11-07 (×2): qty 2

## 2015-11-07 MED ORDER — LACTATED RINGERS IV SOLN
INTRAVENOUS | Status: DC
Start: 2015-11-07 — End: 2015-11-09
  Administered 2015-11-07 (×2): via INTRAVENOUS

## 2015-11-07 MED ORDER — METHYLERGONOVINE MALEATE 0.2 MG PO TABS: 0.2000 mg | ORAL_TABLET | ORAL | Status: DC | PRN

## 2015-11-07 MED ORDER — PROMETHAZINE HCL 25 MG/ML IJ SOLN
INTRAMUSCULAR | Status: AC
  Filled 2015-11-07: qty 1

## 2015-11-07 MED ORDER — SODIUM BICARBONATE 8.4 % IV SOLN
INTRAVENOUS | Status: AC
  Filled 2015-11-07: qty 50

## 2015-11-07 MED ORDER — DIPHENHYDRAMINE HCL 25 MG PO CAPS
25.0000 mg | ORAL_CAPSULE | ORAL | Status: DC | PRN
  Filled 2015-11-07: qty 1

## 2015-11-07 MED ORDER — NALOXONE HCL 2 MG/2ML IJ SOSY
1.0000 ug/kg/h | PREFILLED_SYRINGE | INTRAVENOUS | Status: DC | PRN
  Filled 2015-11-07: qty 2

## 2015-11-07 MED ORDER — LACTATED RINGERS IV SOLN
INTRAVENOUS | Status: DC | PRN
  Administered 2015-11-07: 09:00:00 via INTRAVENOUS

## 2015-11-07 MED ORDER — ZOLPIDEM TARTRATE 5 MG PO TABS: 5.0000 mg | ORAL_TABLET | Freq: Every evening | ORAL | Status: DC | PRN

## 2015-11-07 MED ORDER — BUPIVACAINE HCL (PF) 0.25 % IJ SOLN
INTRAMUSCULAR | Status: AC
  Filled 2015-11-07: qty 10

## 2015-11-07 MED ORDER — NALBUPHINE HCL 10 MG/ML IJ SOLN
5.0000 mg | INTRAMUSCULAR | Status: DC | PRN
Start: 2015-11-07 — End: 2015-11-09

## 2015-11-07 MED ORDER — SIMETHICONE 80 MG PO CHEW
80.0000 mg | CHEWABLE_TABLET | Freq: Three times a day (TID) | ORAL | Status: DC
  Administered 2015-11-07 – 2015-11-09 (×6): 80 mg via ORAL
  Filled 2015-11-07 (×11): qty 1

## 2015-11-07 MED ORDER — ONDANSETRON HCL 4 MG/2ML IJ SOLN: 4.0000 mg | Freq: Three times a day (TID) | INTRAMUSCULAR | Status: DC | PRN

## 2015-11-07 MED ORDER — LANOLIN HYDROUS EX OINT
1.0000 | TOPICAL_OINTMENT | CUTANEOUS | Status: DC | PRN
Start: 2015-11-07 — End: 2015-11-09

## 2015-11-07 MED ORDER — BUPIVACAINE HCL (PF) 0.25 % IJ SOLN
INTRAMUSCULAR | Status: DC | PRN
  Administered 2015-11-07: 10 mL

## 2015-11-07 MED ORDER — MIDAZOLAM HCL 2 MG/2ML IJ SOLN
INTRAMUSCULAR | Status: DC | PRN
  Administered 2015-11-07: 2 mg via INTRAVENOUS

## 2015-11-07 MED ORDER — PRENATAL MULTIVITAMIN CH
1.0000 | ORAL_TABLET | Freq: Every day | ORAL | Status: DC
  Administered 2015-11-08 – 2015-11-09 (×2): 1 via ORAL
  Filled 2015-11-07 (×4): qty 1

## 2015-11-07 MED ORDER — KETOROLAC TROMETHAMINE 30 MG/ML IJ SOLN: 30.0000 mg | Freq: Four times a day (QID) | INTRAMUSCULAR | Status: AC | PRN

## 2015-11-07 MED ORDER — DEXTROSE 5 % IV SOLN
2.0000 g | Freq: Once | INTRAVENOUS | Status: AC
  Administered 2015-11-07: 2 g via INTRAVENOUS
  Filled 2015-11-07: qty 2

## 2015-11-07 MED ORDER — CARBOPROST TROMETHAMINE 250 MCG/ML IM SOLN
INTRAMUSCULAR | Status: DC | PRN
Start: 2015-11-07 — End: 2015-11-07
  Administered 2015-11-07: 250 ug via INTRAMUSCULAR

## 2015-11-07 MED ORDER — LIDOCAINE-EPINEPHRINE (PF) 2 %-1:200000 IJ SOLN
INTRAMUSCULAR | Status: AC
  Filled 2015-11-07: qty 20

## 2015-11-07 MED ORDER — SODIUM CHLORIDE 0.9 % IJ SOLN
INTRAMUSCULAR | Status: AC
  Filled 2015-11-07: qty 20

## 2015-11-07 MED ORDER — SCOPOLAMINE 1 MG/3DAYS TD PT72
MEDICATED_PATCH | TRANSDERMAL | Status: AC
  Filled 2015-11-07: qty 1

## 2015-11-07 MED ORDER — MEPERIDINE HCL 25 MG/ML IJ SOLN
INTRAMUSCULAR | Status: DC | PRN
  Administered 2015-11-07 (×2): 12.5 mg via INTRAVENOUS

## 2015-11-07 MED ORDER — BUPIVACAINE LIPOSOME 1.3 % IJ SUSP
INTRAMUSCULAR | Status: DC | PRN
  Administered 2015-11-07: 20 mL

## 2015-11-07 MED ORDER — IBUPROFEN 600 MG PO TABS
600.0000 mg | ORAL_TABLET | Freq: Four times a day (QID) | ORAL | Status: DC
  Administered 2015-11-07 – 2015-11-09 (×7): 600 mg via ORAL
  Filled 2015-11-07 (×7): qty 1

## 2015-11-07 MED ORDER — LACTATED RINGERS IV SOLN
40.0000 [IU] | INTRAVENOUS | Status: DC | PRN
  Administered 2015-11-07: 40 [IU] via INTRAVENOUS

## 2015-11-07 MED ORDER — SODIUM CHLORIDE 0.9% FLUSH: 3.0000 mL | INTRAVENOUS | Status: DC | PRN

## 2015-11-07 MED ORDER — LABETALOL HCL 100 MG PO TABS
100.0000 mg | ORAL_TABLET | Freq: Two times a day (BID) | ORAL | Status: DC
  Administered 2015-11-07 – 2015-11-09 (×4): 100 mg via ORAL
  Filled 2015-11-07 (×6): qty 1

## 2015-11-07 MED ORDER — SODIUM CHLORIDE 0.9 % IR SOLN
Status: DC | PRN
  Administered 2015-11-07: 1000 mL

## 2015-11-07 MED ORDER — SCOPOLAMINE 1 MG/3DAYS TD PT72: 1.0000 | MEDICATED_PATCH | Freq: Once | TRANSDERMAL | Status: DC

## 2015-11-07 MED ORDER — ONDANSETRON HCL 4 MG/2ML IJ SOLN
INTRAMUSCULAR | Status: DC | PRN
  Administered 2015-11-07: 4 mg via INTRAVENOUS

## 2015-11-07 MED ORDER — SODIUM BICARBONATE 8.4 % IV SOLN
INTRAVENOUS | Status: DC | PRN
  Administered 2015-11-07: 5 mL via EPIDURAL
  Administered 2015-11-07: 2 mL via EPIDURAL
  Administered 2015-11-07 (×2): 3 mL via EPIDURAL
  Administered 2015-11-07: 5 mL via EPIDURAL
  Administered 2015-11-07: 2 mL via EPIDURAL

## 2015-11-07 MED ORDER — KETOROLAC TROMETHAMINE 30 MG/ML IJ SOLN
INTRAMUSCULAR | Status: AC
  Filled 2015-11-07: qty 1

## 2015-11-07 MED ORDER — MEPERIDINE HCL 25 MG/ML IJ SOLN
INTRAMUSCULAR | Status: AC
  Filled 2015-11-07: qty 1

## 2015-11-07 MED ORDER — TETANUS-DIPHTH-ACELL PERTUSSIS 5-2.5-18.5 LF-MCG/0.5 IM SUSP
0.5000 mL | Freq: Once | INTRAMUSCULAR | Status: DC
  Filled 2015-11-07: qty 0.5

## 2015-11-07 MED ORDER — BUPIVACAINE LIPOSOME 1.3 % IJ SUSP
20.0000 mL | INTRAMUSCULAR | Status: DC
  Filled 2015-11-07: qty 20

## 2015-11-07 MED ORDER — SIMETHICONE 80 MG PO CHEW
80.0000 mg | CHEWABLE_TABLET | ORAL | Status: DC
  Administered 2015-11-07 – 2015-11-08 (×2): 80 mg via ORAL
  Filled 2015-11-07 (×4): qty 1

## 2015-11-07 MED ORDER — MORPHINE SULFATE (PF) 0.5 MG/ML IJ SOLN
INTRAMUSCULAR | Status: AC
  Filled 2015-11-07: qty 10

## 2015-11-07 MED ORDER — DEXAMETHASONE SODIUM PHOSPHATE 4 MG/ML IJ SOLN
INTRAMUSCULAR | Status: AC
  Filled 2015-11-07: qty 1

## 2015-11-07 MED ORDER — DIPHENHYDRAMINE HCL 50 MG/ML IJ SOLN: 12.5000 mg | INTRAMUSCULAR | Status: DC | PRN

## 2015-11-07 MED ORDER — SENNOSIDES-DOCUSATE SODIUM 8.6-50 MG PO TABS
2.0000 | ORAL_TABLET | ORAL | Status: DC
  Administered 2015-11-07 – 2015-11-08 (×2): 2 via ORAL
  Filled 2015-11-07 (×4): qty 2

## 2015-11-07 MED ORDER — MIDAZOLAM HCL 2 MG/2ML IJ SOLN
INTRAMUSCULAR | Status: AC
  Filled 2015-11-07: qty 2

## 2015-11-07 MED ORDER — DIPHENHYDRAMINE HCL 50 MG/ML IJ SOLN
INTRAMUSCULAR | Status: DC | PRN
  Administered 2015-11-07: 25 mg via INTRAVENOUS

## 2015-11-07 SURGICAL SUPPLY — 40 items
BENZOIN TINCTURE PRP APPL 2/3 (GAUZE/BANDAGES/DRESSINGS) ×3 IMPLANT
CLAMP CORD UMBIL (MISCELLANEOUS) IMPLANT
CLOSURE STERI-STRIP 1/2X4 (GAUZE/BANDAGES/DRESSINGS) ×1
CLOTH BEACON ORANGE TIMEOUT ST (SAFETY) ×3 IMPLANT
CLSR STERI-STRIP ANTIMIC 1/2X4 (GAUZE/BANDAGES/DRESSINGS) ×2 IMPLANT
CONTAINER PREFILL 10% NBF 15ML (MISCELLANEOUS) IMPLANT
DRAPE SHEET LG 3/4 BI-LAMINATE (DRAPES) IMPLANT
DRSG OPSITE POSTOP 4X10 (GAUZE/BANDAGES/DRESSINGS) ×3 IMPLANT
DURAPREP 26ML APPLICATOR (WOUND CARE) ×3 IMPLANT
ELECT REM PT RETURN 9FT ADLT (ELECTROSURGICAL) ×3
ELECTRODE REM PT RTRN 9FT ADLT (ELECTROSURGICAL) ×1 IMPLANT
EXTRACTOR VACUUM M CUP 4 TUBE (SUCTIONS) IMPLANT
EXTRACTOR VACUUM M CUP 4' TUBE (SUCTIONS)
GLOVE BIO SURGEON STRL SZ7.5 (GLOVE) ×3 IMPLANT
GLOVE BIOGEL PI IND STRL 7.0 (GLOVE) ×1 IMPLANT
GLOVE BIOGEL PI INDICATOR 7.0 (GLOVE) ×2
GOWN STRL REUS W/TWL LRG LVL3 (GOWN DISPOSABLE) ×6 IMPLANT
KIT ABG SYR 3ML LUER SLIP (SYRINGE) IMPLANT
NEEDLE HYPO 22GX1.5 SAFETY (NEEDLE) ×6 IMPLANT
NEEDLE HYPO 25X5/8 SAFETYGLIDE (NEEDLE) IMPLANT
NEEDLE SPNL 20GX3.5 QUINCKE YW (NEEDLE) IMPLANT
NEEDLE SPNL 22GX1.5 QUINCKE BK (NEEDLE) ×3 IMPLANT
NS IRRIG 1000ML POUR BTL (IV SOLUTION) ×3 IMPLANT
PACK C SECTION WH (CUSTOM PROCEDURE TRAY) ×3 IMPLANT
PENCIL SMOKE EVAC W/HOLSTER (ELECTROSURGICAL) ×3 IMPLANT
SUT MNCRL 0 VIOLET CTX 36 (SUTURE) ×2 IMPLANT
SUT MNCRL AB 3-0 PS2 27 (SUTURE) IMPLANT
SUT MON AB 2-0 CT1 27 (SUTURE) ×3 IMPLANT
SUT MON AB 3-0 SH 27 (SUTURE) ×2
SUT MON AB 3-0 SH27 (SUTURE) ×1 IMPLANT
SUT MON AB-0 CT1 36 (SUTURE) ×9 IMPLANT
SUT MONOCRYL 0 CTX 36 (SUTURE) ×4
SUT PLAIN 0 NONE (SUTURE) IMPLANT
SUT PLAIN 2 0 (SUTURE)
SUT PLAIN 2 0 XLH (SUTURE) IMPLANT
SUT PLAIN ABS 2-0 CT1 27XMFL (SUTURE) IMPLANT
SYR 20CC LL (SYRINGE) IMPLANT
SYR CONTROL 10ML LL (SYRINGE) ×3 IMPLANT
TOWEL OR 17X24 6PK STRL BLUE (TOWEL DISPOSABLE) ×3 IMPLANT
TRAY FOLEY CATH SILVER 14FR (SET/KITS/TRAYS/PACK) ×3 IMPLANT

## 2015-11-07 NOTE — Anesthesia Postprocedure Evaluation (Signed)
Anesthesia Post Note  Patient: Sarah Gibson  Procedure(s) Performed: Procedure(s) (LRB): CESAREAN SECTION (N/A)  Patient location during evaluation: PACU Anesthesia Type: Epidural Level of consciousness: awake Pain management: pain level controlled Vital Signs Assessment: post-procedure vital signs reviewed and stable Respiratory status: spontaneous breathing Cardiovascular status: stable Postop Assessment: no headache, no backache, patient able to bend at knees, epidural receding and no signs of nausea or vomiting Anesthetic complications: no    Last Vitals:  Filed Vitals:   11/07/15 1015 11/07/15 1030  BP: 149/95 122/109  Pulse: 120 107  Temp:    Resp: 18 26    Last Pain:  Filed Vitals:   11/07/15 1109  PainSc: 6                  Lashonda Sonneborn JR,JOHN Gad Aymond

## 2015-11-07 NOTE — Consult Note (Signed)
Neonatology Note:   Attendance at C-section:   I was asked by Dr. Taavon to attend this primary C/S at term after failed induction. The mother is a G1P0 O pos, GBS neg with gestational hypertension. ROM 25 hours prior to delivery, fluid with light meconium. Mother's temperature prior to c-section was rising and was 100.3 degrees with some mild fetal tachycardia. Infant was breathing with good HR and tone, but had thick mucous in her throat, which I bulb suctioned, after which she became more vigorous. We performed chest PT and suctioned to the stomach with a DeLee, getting 1-2 ml of thick, green mucous. Ap 8/9. Lungs clear to ausc in DR. PE remarkable for asymmetry of face and neck due to in utero position. To CN to care of Pediatrician.  Isadore Palecek C. Tvisha Schwoerer, MD 

## 2015-11-07 NOTE — Progress Notes (Signed)
Sarah Gibson is a 25 y.o. G1P0 at [redacted]w[redacted]d by LMP admitted for induction of labor due to Hypertension.  Subjective: Comfortable  Objective: BP 130/79 mmHg  Pulse 114  Temp(Src) 100.3 F (37.9 C) (Axillary)  Resp 16  Ht  (1.727 m)  Wt 138.347 kg (305 lb)  BMI 46.39 kg/m2  SpO2 100% I/O last 3 completed shifts: In: -  Out: 3900 [Urine:3900] Total I/O In: -  Out: 200 [Urine:200]  FHT:  FHR: 160 bpm, variability: moderate,  accelerations:  Present,  decelerations:  Absent UC:   regular, every 3 minutes inadequate by IUPC SVE:   Dilation: 4.5 Effacement (%): 80 Station: -2 Exam by:: Sarah Gelinas, RN  Labs: Lab Results  Component Value Date   WBC 13.5* 11/06/2015   HGB 13.4 11/06/2015   HCT 37.8 11/06/2015   MCV 83.8 11/06/2015   PLT 256 11/06/2015    Assessment / Plan: Arrest in active phase of labor  Gestational HTN  Labor: no progress Preeclampsia:  no signs or symptoms of toxicity, intake and ouput balanced and labs stable Fetal Wellbeing:  Category I Pain Control:  Epidural I/D:  n/a Anticipated MOD:  Proceed with csection. Consent done  Sarah Gibson J 11/07/2015, 8:51 AM

## 2015-11-07 NOTE — Lactation Note (Signed)
This note was copied from the chart of [facility] [employer]. Lactation Consultation Note  Patient Name: Sarah Gibson Today's Date: 11/07/2015 Reason for consult: Initial assessment   With this mom and term baby, now 36 hours old, and weighs just over 9 pounds. The baby is very sleepy, and mom has not been abel to get the baby latched or sucking. I tried a 20 nipple shield, the baby latched, but went to sleep. With hand expression and pumping, mom was able to spoon and finger feed a total of about 0.7 ml's. Mom knows to hand express after pumping, and to pump if she can not get baby latched.  The baby has what appears to be a posterior short, tight frenulum - the tongue elevates only half way to the palate, with a bowl shape.  Mom knows to call for questions/concerns.    Maternal Data Formula Feeding for Exclusion: No Has patient been taught Hand Expression?: Yes Does the patient have breastfeeding experience prior to this delivery?: No  Feeding Feeding Type: Breast Milk  LATCH Score/Interventions Latch: Repeated attempts needed to sustain latch, nipple held in mouth throughout feeding, stimulation needed to elicit sucking reflex. 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 Intervention(s): Breastfeeding basics reviewed;Support Pillows;Position options;Skin to skin  LATCH Score: 5  Lactation Tools Discussed/Used Tools: Nipple Shields Nipple shield size: 20 Pump Review: Setup, frequency, and cleaning;Milk Storage;Other (comment) Initiated by:: c Nedra Hai  RN IBCLC Date initiated:: 11/07/15   Consult  Status Consult Status: Follow-up Date: 11/08/15 Follow-up type: In-patient    Alfred Levins 11/07/2015, 7:01 PM

## 2015-11-07 NOTE — Progress Notes (Signed)
Sarah Gibson is a 25 y.o. G1P0 at [redacted]w[redacted]d by LMP admitted for induction of labor due to Hypertension.  Subjective: Comfortable No headache  Objective: BP 138/86 mmHg  Pulse 100  Temp(Src) 97.7 F (36.5 C) (Oral)  Resp 20  Ht  (1.727 m)  Wt 138.347 kg (305 lb)  BMI 46.39 kg/m2  SpO2 100% I/O last 3 completed shifts: In: -  Out: 1600 [Urine:1600] Total I/O In: -  Out: 2050 [Urine:2050]  FHT:  FHR: 155 bpm, variability: moderate,  accelerations:  Present,  decelerations:  Absent UC:   regular, every 2-3 minutes Inadequate by IUPC SVE:   deferred  Labs: Lab Results  Component Value Date   WBC 13.5* 11/06/2015   HGB 13.4 11/06/2015   HCT 37.8 11/06/2015   MCV 83.8 11/06/2015   PLT 256 11/06/2015    Assessment / Plan: Protracted latent phase  Gestational HTN- stable, nl labs  Labor: Reck at 0700 Preeclampsia:  no signs or symptoms of toxicity, intake and ouput balanced and labs stable Fetal Wellbeing:  Category I Pain Control:  Epidural I/D:  n/a Anticipated MOD:  csection if no progress noted  Janaisha Tolsma J 11/07/2015, 6:21 AM

## 2015-11-07 NOTE — Lactation Note (Signed)
This note was copied from the chart of Sarah [employer]. Lactation Consultation Note  Patient Name: Sarah Gibson [id number] Date: 11/07/2015 Reason for consult: Follow-up assessment Baby at 13 hr of life and not feeding well per RN. Baby was alert. Mom offered the breast but baby was jittery and gaging with lots of bubbles in her mouth. Mom was able to manually express 2 drops of colostrum to rub in baby's cheeks. Mom has small wide spaced breast with stretch marks, nipple skin tags, and nipple hair. Mom reports having cysts on her ovaries. Encouraged mom to offer the breast 8+/24hr. She can pre pump to stimulate the nipple. If baby does not latch post pump. LC left baby sts and gave report to RN.   Maternal Data    Feeding Feeding Type: Breast Fed Length of feed: 0 min  LATCH Score/Interventions Latch: Too sleepy or reluctant, no latch achieved, no sucking elicited.  Audible Swallowing: None                 Lactation Tools Discussed/Used     Consult Status Consult Status: Follow-up Date: 11/08/15 Follow-up type: In-patient    Rulon Eisenmenger 11/07/2015, 10:59 PM

## 2015-11-07 NOTE — Progress Notes (Signed)
Care-handoff given, report received from [place], RN  

## 2015-11-07 NOTE — Transfer of Care (Signed)
Immediate Anesthesia Transfer of Care Note  Patient: Sarah Gibson  Procedure(s) Performed: Procedure(s): CESAREAN SECTION (N/A)  Patient Location: PACU  Anesthesia Type:Epidural  Level of Consciousness: awake, alert , oriented and patient cooperative  Airway & Oxygen Therapy: Patient Spontanous Breathing and Patient connected to nasal cannula oxygen  Post-op Assessment: Report given to RN and Post -op Vital signs reviewed and stable  Post vital signs: Reviewed and stable--pt remains tachycardia with a slight temperature.  Last Vitals:  Filed Vitals:   11/07/15 0732 11/07/15 0807  BP: 130/79   Pulse: 114   Temp: 37 C 37.9 C  Resp: 16     Complications: No apparent anesthesia complications

## 2015-11-07 NOTE — Op Note (Signed)
Cesarean Section Procedure Note  Indications: failure to progress: arrest of dilation  Pre-operative Diagnosis: 40 week 1 day pregnancy.  Post-operative Diagnosis: same  Surgeon: Lenoard Aden   Assistants: none  Anesthesia: Epidural anesthesia and Local anesthesia 0.25.% bupivacaine  ASA Class: 2  Procedure Details  The patient was seen in the Holding Room. The risks, benefits, complications, treatment options, and expected outcomes were discussed with the patient.  The patient concurred with the proposed plan, giving informed consent. The risks of anesthesia, infection, bleeding and possible injury to other organs discussed. Injury to bowel, bladder, or ureter with possible need for repair discussed. Possible need for transfusion with secondary risks of hepatitis or HIV acquisition discussed. Post operative complications to include but not limited to DVT, PE and Pneumonia noted. The site of surgery properly noted/marked. The patient was taken to Operating Room # 1, identified as [name] and the procedure verified as C-Section Delivery. A Time Out was held and the above information confirmed.  After induction of anesthesia, the patient was draped and prepped in the usual sterile manner. A Pfannenstiel incision was made and carried down through the subcutaneous tissue to the fascia. Fascial incision was made and extended transversely using Mayo scissors. The fascia was separated from the underlying rectus tissue superiorly and inferiorly. The peritoneum was identified and entered. Peritoneal incision was extended longitudinally. The utero-vesical peritoneal reflection was incised transversely and the bladder flap was bluntly freed from the lower uterine segment. A low transverse uterine incision(Kerr hysterotomy) was made. Delivered from OT presentation was a  female with Apgar scores of 8 at one minute and 9 at five minutes. Bulb suctioning gently performed. Neonatal team in  attendance.After the umbilical cord was clamped and cut cord blood was obtained for evaluation. The placenta was removed intact and appeared normal. The uterus was curetted with a dry lap pack. Good hemostasis was noted.The uterine outline, tubes and ovaries appeared normal. The uterine incision was closed with running locked sutures of 0 Monocryl x 2 layers. Hemostasis was observed. Hemabate injected into myometrium. Excellent uterine tone observed. The parietal peritoneum was closed with a running 2-0 Monocryl suture. The fascia was then reapproximated with running sutures of 0 Monocryl. The skin was reapproximated with 3-0 monocryl after [place] closure with 2-0 plain.  Instrument, sponge, and needle counts were correct prior the abdominal closure and at the conclusion of the case.   Findings: FLTF, anterior placenta, nl tubes and ovaries  Estimated Blood Loss:  800         Drains: foley                 Specimens: placenta                 Complications:  None; patient tolerated the procedure well.         Disposition: PACU - hemodynamically stable.         Condition: stable  Attending Attestation: I performed the procedure.

## 2015-11-08 ENCOUNTER — Encounter (HOSPITAL_COMMUNITY): Payer: Self-pay | Admitting: Obstetrics and Gynecology

## 2015-11-08 LAB — CBC
HEMATOCRIT: 28.1 % — AB (ref 36.0–46.0)
Hemoglobin: 9.8 g/dL — ABNORMAL LOW (ref 12.0–15.0)
MCH: 29.8 pg (ref 26.0–34.0)
MCHC: 34.9 g/dL (ref 30.0–36.0)
MCV: 85.4 fL (ref 78.0–100.0)
PLATELETS: 209 10*3/uL (ref 150–400)
RBC: 3.29 MIL/uL — ABNORMAL LOW (ref 3.87–5.11)
RDW: 14.2 % (ref 11.5–15.5)
WBC: 16.2 10*3/uL — AB (ref 4.0–10.5)

## 2015-11-08 MED ORDER — OXYCODONE-ACETAMINOPHEN 5-325 MG PO TABS
1.0000 | ORAL_TABLET | ORAL | Status: DC | PRN
  Administered 2015-11-08 – 2015-11-09 (×4): 2 via ORAL
  Filled 2015-11-08: qty 2
  Filled 2015-11-08: qty 1
  Filled 2015-11-08: qty 2
  Filled 2015-11-08: qty 1
  Filled 2015-11-08: qty 2

## 2015-11-08 NOTE — Progress Notes (Signed)
Patient ID: Sarah Gibson, female   DOB: 1991-09-05, 25 y.o.   MRN: [id number] Patient in shower at time of rounds / CNM will return after surgery / spouse notified.  Kenard Gower, MSN, CNM 11/08/2015, 9:34 AM

## 2015-11-08 NOTE — Lactation Note (Signed)
This note was copied from the chart of [employer]. Lactation Consultation Note Follow up visit at 35 hours of age.  Parents are concerned that baby has not been feeding well.  Formula is on counter, but parents haven't started any yet.  Baby is asleep in crib with last feeding a few hours ago. Assisted with hand expression on both breast and didn't see even a glisten of colostrum.  Mom is able to apply NS, LC fit mom for #24 with somewhat improved fit.  Mom has not been using NS with latch attempts and complains of pain with nipple bruising noted. Baby is holding mouth closed with pursed lips and not sticking out tongue.  Baby has tight tone and quite jittery.  Baby has a weak cry and disorganized suck with gloved finger.  LC asked for permission to supplement with formula by finger feeding to lessen stress on baby. Baby tolerated finger feeding well and then attempted latch with NS but baby refused NS.  LC fed baby additional due to feeding cues and increased tone remains.  Baby more relaxed after feeding, jitteriness mostly resolved.  Lc place baby STS with mom.  Encouraged mom to pump every 3 hours and latch baby as desired, mom may take a break due to nipple pain.   Mom to supplement with formula pre or post feedings as needed to satisfy baby.  Report given to [facility] RN.  Mom to call for assist as needed.       Patient Name: Sarah Gibson [id number] Date: 11/08/2015 Reason for consult: Follow-up assessment;Breast/nipple pain;Difficult latch   Maternal Data Has patient been taught Hand Expression?: Yes Does the patient have breastfeeding experience prior to this delivery?: No  Feeding Feeding Type: Breast Fed Length of feed: 5 min  LATCH Score/Interventions Latch: Too sleepy or reluctant, no latch achieved, no sucking elicited. Intervention(s): Skin to skin;Teach feeding cues;Waking techniques Intervention(s): Breast compression;Breast massage  Audible Swallowing: None  Type of  Nipple: Flat Intervention(s): Double electric pump  Comfort (Breast/Nipple): Filling, red/small blisters or bruises, mild/mod discomfort  Problem noted: Mild/Moderate discomfort  Hold (Positioning): Assistance needed to correctly position infant at breast and maintain latch. Intervention(s): Breastfeeding basics reviewed;Support Pillows;Position options;Skin to skin  LATCH Score: 3  Lactation Tools Discussed/Used Nipple shield size: 24   Consult Status Consult Status: Follow-up Date: 11/09/15 Follow-up type: In-patient    Shaylynne Lunt, Arvella Merles 11/08/2015, 8:11 PM

## 2015-11-08 NOTE — Progress Notes (Signed)
Patient ID: Sarah Gibson, female   DOB: Feb 13, 1991, 25 y.o.   MRN: [id number] Subjective: S/P Primary Cesarean Delivery for Arrest of Dilation POD# 1 Information for the patient's newborn:  Erik, Nessel [[id number]]  female  Reports feeling sore, but well. Feeding: breast Patient reports tolerating PO.  Breast symptoms: small amounts of colostrum - difficulty getting any with pumping Pain controlled with ibuprofen (OTC) and narcotic analgesics including Percocet Denies HA/SOB/C/P/N/V/dizziness. Flatus absent. No BM. She reports vaginal bleeding as normal, without clots.  She is ambulating, urinating without difficult.     Objective:   VS:  Filed Vitals:   11/07/15 2017 11/08/15 0520 11/08/15 0930 11/08/15 0939  BP: 114/72 139/68 128/72 128/72  Pulse: 110 92 84 84  Temp:  98.3 F (36.8 C) 98.1 F (36.7 C)   TempSrc:  Oral Oral   Resp: Height:      Weight:      SpO2:  98% 98%      Intake/Output Summary (Last 24 hours) at 11/08/15 1242 Last data filed at 11/08/15 1215  Gross per 24 hour  Intake   1200 ml  Output   3525 ml  Net  -2325 ml        Recent Labs  11/06/15 0840 11/08/15 0530  WBC 13.5* 16.2*  HGB 13.4 9.8*  HCT 37.8 28.1*  PLT 256 209     Blood type: O POS (01/25 0122)  Rubella: Immune (06/16 0000)     Physical Exam:   General: alert, cooperative, fatigued, no distress and morbidly obese  CV: Regular rate and rhythm, S1S2 present or without murmur or extra heart sounds  Resp: clear  Abdomen: soft, nontender, normal bowel sounds  Incision: serous drainage present and skin well-approximated with sutures  Uterine Fundus: firm, umbilicus even, non-tender / difficult assessment d/t maternal habitus  Lochia: minimal  Ext: edema 2+ and Homans sign is negative, no sign of DVT   Assessment/Plan: 25 y.o.   POD# 1.  S/P Cesarean Delivery.  Indications: arrest of dilation                Principal Problem:   Postpartum care following  cesarean delivery (1/26) Active Problems:   Hypertension in pregnancy  Doing well, stable.               Regular diet as tolerated Ambulate Routine post-op care  Kenard Gower, MSN, CNM 11/08/2015, 12:42 PM

## 2015-11-09 MED ORDER — OXYCODONE-ACETAMINOPHEN 5-325 MG PO TABS: 1.0000 | ORAL_TABLET | ORAL | Status: DC | PRN

## 2015-11-09 MED ORDER — IBUPROFEN 600 MG PO TABS: 600.0000 mg | ORAL_TABLET | Freq: Four times a day (QID) | ORAL | Status: DC

## 2015-11-09 NOTE — Lactation Note (Signed)
This note was copied from the chart of [facility] [employer]. Lactation Consultation Note  Mother attempting to latch baby in football hold.  Baby sleepy. Reviewed how to wake baby.  Helped latch baby w/ teacup hold and taught FOB how to assist. Baby latches for a few minutes and comes off w/ compressed nipple.   Showed mother how to achieve depth.  Talked to parents about using NS if it helps baby sustains latch. Recommend mother post pump 4-6x day for 10-15 min to stimulate milk supply. Give volume pumped back to baby at next feeding. Follow volume guidelines and give the diifference w/ formula. Mother's nipples are bruised.  Provided comfort gels and discussed applying ebm. Reviewed engorgement care and monitoring voids/stools. Set up Outpatient appt for 2/5.     Patient Name: Sarah Gibson [id number] Date: 11/09/2015 Reason for consult: Follow-up assessment   Maternal Data    Feeding Nipple Type: Slow - flow  LATCH Score/Interventions                      Lactation Tools Discussed/Used     Consult Status Consult Status: Complete    Hardie Pulley 11/09/2015, 12:23 PM

## 2015-11-09 NOTE — Lactation Note (Deleted)
This note was copied from the chart of [facility] [employer]. Lactation Consultation Note  Mother recently hand expressed 4 ml of colostrum. Mother hand expressed good flow before latching. Gave baby 4 ml of colostrum w/ syringe and finger. Assisted mother w/ achieving a deep latch with compression.  Taught FOB how to help latch. Sucks and a few swallows observed but baby continues to be sleepy at the breast. Reviewed waking techniques. Encouraged mother to post pump 4-6 x day for 10-15 min.  Give volume back to baby w/ slow flow nipple. Noted distinct upper frenulum.  Baby has strong suck on LC's finger. Suggest parents continue to supplement after feedings until mother is pumping more breastmilk. Reviewed volume guidelines. Reviewed engorgement care and monitoring voids/stools.    Patient Name: Sarah Gibson [id number] Date: 11/09/2015 Reason for consult: Follow-up assessment   Maternal Data    Feeding Feeding Type: Breast Fed Nipple Type: Slow - flow Length of feed: 15 min (off and on)  LATCH Score/Interventions Latch: Grasps breast easily, tongue down, lips flanged, rhythmical sucking.  Audible Swallowing: A few with stimulation  Type of Nipple: Everted at rest and after stimulation  Comfort (Breast/Nipple): Filling, red/small blisters or bruises, mild/mod discomfort  Problem noted: Mild/Moderate discomfort  Hold (Positioning): Assistance needed to correctly position infant at breast and maintain latch.  LATCH Score: 7  Lactation Tools Discussed/Used     Consult Status Consult Status: Complete    Hardie Pulley 11/09/2015, 10:37 AM

## 2015-11-09 NOTE — Clinical Social Work Maternal (Signed)
CLINICAL SOCIAL WORK MATERNAL/CHILD NOTE  Patient Details  Name: Sarah Gibson MRN: [id number] Date of Birth: 10/16/1990  Date: 11/09/2015  Clinical Social Worker Initiating Note: Kobyn Kray, LCSWDate/ Time Initiated: 11/09/15/1045   Child's Name: Sarah Gibson   Legal Guardian:  (Parents Curtis and Zannah Thornley)   Need for Interpreter: None   Date of Referral: 11/09/15   Reason for Referral: Other (Comment)   Referral Source: [facility]   Address: [address] [place], [place] [address]  Phone number:  ([phone])   Household Members: Spouse   Natural Supports (not living in the home): Extended Family, Immediate Family, Friends   Professional Supports:None   Employment: (Both parents are employed)   Type of Work:     Education:     Financial Resources:Private Insurance   Other Resources:     Cultural/Religious Considerations Which May Impact Care: none noted  Strengths: Ability to meet basic needs , Home prepared for child    Risk Factors/Current Problems: None   Cognitive State: Able to Concentrate , Alert    Mood/Affect: Happy    CSW Assessment: Acknowledged order for social work consult to assess mother's hx of anxiety. Met with mother who was pleasant and receptive to social work. She is married with no other dependents. Spouse and maternal grandmother was present and very attentive. [name] reports hx of anxiety. Informed that she was prescribed medication in the past, but "have learned how to manage the symptoms for the most part". Informed that she usually have increase anxiety during highly stressful situations or when riding as a passenger in a car. She denies current symptoms of depression or anxiety. She denies any use of alcohol or illicit drug use during pregnancy. She reports having an excellent support system. No acute social concerns noted or reported at this time. Affect and  behavior was appropriate during the entire visit. Mother informed of social work availability.  CSW Plan/Description:    Discussed signs/symptoms of PP Depression and available resources No further intervention required No barriers to discharge   Rosielee Corporan J, LCSW 11/09/2015, 11:11 AM    CLINICAL SOCIAL WORK MATERNAL/CHILD NOTE  Patient Details  Name: Sarah Gibson MRN: [id number] Date of Birth: [birth year]  Date:  11/09/2015  Clinical Social Worker Initiating Note:  Norlene Duel, LCSW Date/ Time Initiated:  11/09/15/1045     Child's Name:  Sarah Gibson   Legal Guardian:   (Parents Vicente Serene and Anastasio Champion)   Need for Interpreter:  None   Date of Referral:  11/09/15     Reason for Referral:  Other (Comment)   Referral Source:  [facility]   Address:  [place], [place] [address]  Phone number:   [phone])   Household Members:  Spouse   Natural Supports (not living in the home):  Extended Family, Immediate Family, Friends   Chiropodist: None   Employment:  (Both parents are employed)   Type of Work:     Education:      Pensions consultant:  Multimedia programmer   Other Resources:      Cultural/Religious Considerations Which May Impact Care:  none noted  Strengths:  Ability to meet basic needs , Home prepared for child    Risk Factors/Current Problems:  None   Cognitive State:  Able to Concentrate , Alert    Mood/Affect:  Happy    CSW Assessment:  Acknowledged order for social work consult to assess mother's hx of anxiety. Met with mother who was pleasant and receptive to social work. She is married with no other dependents.  Spouse and maternal grandmother was present and very attentive.   [name] reports hx of anxiety.   Informed that she was prescribed medication in the past, but "have learned how to manage the symptoms for the most part".  Informed that she usually have increase anxiety during highly stressful situations or when riding as a passenger in a car.   She denies current symptoms of depression or anxiety. She denies any use of alcohol or illicit drug use during pregnancy.  She reports having an excellent support system.  No acute social concerns noted or reported at this time. Affect and behavior was  appropriate during the entire visit. Mother informed of social work Fish farm manager.  CSW Plan/Description:     Discussed signs/symptoms of PP Depression and available resources No further intervention required No barriers to discharge   Pascha Fogal J, LCSW 11/09/2015, 11:11 AM

## 2015-11-09 NOTE — Discharge Summary (Signed)
POSTOPERATIVE DISCHARGE SUMMARY:  Patient ID: Sarah Gibson MRN: [id number] DOB/AGE: 25-24-1992 25 y.o.  Admit date: 11/06/2015 Admission Diagnoses: 40 weeks / hypertension   Discharge date:   Discharge Diagnoses: POD 2 s/p CS for arrest of labor / hypertension stable  Prenatal history: G1P1001   EDC : 11/06/2015, by Other Basis  Prenatal care at [facility]  Primary provider : [place] Prenatal course complicated by morbid obesity / pregnancy induced hypertension - labetalol 100 BID  Prenatal Labs: ABO, Rh: --/--/O POS, O POS (01/25 0122)  Antibody: NEG (01/25 0122) Rubella: Immune (06/16 0000)  RPR: Non Reactive (01/25 0122)  HBsAg: Negative (06/16 0000)  HIV: Non-reactive (06/16 0000)  GBS: Negative (12/21 0000)   Medical / Surgical History :  Past medical history:  Past Medical History  Diagnosis Date  . Bell's palsy     AT AGE 25 YEARS  . MVP (mitral valve prolapse)   . Anxiety   . Pregnancy induced hypertension   . Arrhythmia     Past surgical history:  Past Surgical History  Procedure Laterality Date  . Wisdom tooth extraction  2014  . Tonsillectomy and adenoidectomy      AT AGE 25 YEARS  . Cesarean section N/A 11/07/2015    Procedure: CESAREAN SECTION;  Surgeon: Olivia Mackie, MD;  Location: [facility];  Service: Obstetrics;  Laterality: N/A;    Family History:  Family History  Problem Relation Age of Onset  . Hypertension Mother   . Diabetes Mother   . Depression Mother   . Hypertension Father   . Migraines Father     Social History:  reports that she has never smoked. She has never used smokeless tobacco. She reports that she does not drink alcohol or use illicit drugs.  Allergies: Augmentin   Current Medications at time of admission:  Prior to Admission medications   Medication Sig Start Date End Date Taking? Authorizing Provider  labetalol (NORMODYNE) 100 MG tablet Take 1 tablet (100 mg total) by mouth 2 (two) times daily.  06/03/15  Yes Chilton Si, MD  Prenatal Vit-Fe Fumarate-FA (PRENATAL MULTIVITAMIN) TABS tablet Take 1 tablet by mouth at bedtime.    Yes Historical Provider, MD  ranitidine (ZANTAC) 150 MG tablet Take 150 mg by mouth 2 (two) times daily.   Yes Historical Provider, MD  acetaminophen (TYLENOL) 500 MG tablet Take 1,000 mg by mouth every 6 (six) hours as needed for mild pain or headache.     Historical Provider, MD  butalbital-acetaminophen-caffeine (FIORICET, ESGIC) [phone] MG per tablet Take 1 tablet by mouth every 6 (six) hours as needed for headache.  04/23/15   Historical Provider, MD    Intrapartum Course:  Admit for induction labor with labor progression to 4.5 dilation with protracted labor curve Pain management: epidural Complicated by: arrest of active labor Interventions required: primary cesarean section   Procedures: Cesarean section delivery on 11/07/2015 with delivery of female newborn by Dr Billy Coast   See operative report for further details APGAR (1 MIN): 8   APGAR (5 MINS): 9    Postoperative / postpartum course:  Uncomplicated with discharge on POD 2  Discharge Instructions:  Discharged Condition: stable  Activity: pelvic rest and postoperative restrictions x 2   Diet: routine  Medications:    Medication List    TAKE these medications        acetaminophen 500 MG tablet  Commonly known as:  TYLENOL  Take 1,000 mg by mouth every 6 (six) hours as  needed for mild pain or headache.     butalbital-acetaminophen-caffeine 50-325-40 MG tablet  Commonly known as:  FIORICET, ESGIC  Take 1 tablet by mouth every 6 (six) hours as needed for headache.     ibuprofen 600 MG tablet  Commonly known as:  ADVIL,MOTRIN  Take 1 tablet (600 mg total) by mouth every 6 (six) hours.     labetalol 100 MG tablet  Commonly known as:  NORMODYNE  Take 1 tablet (100 mg total) by mouth 2 (two) times daily.     oxyCODONE-acetaminophen 5-325 MG tablet  Commonly known as:   PERCOCET/ROXICET  Take 1-2 tablets by mouth every 4 (four) hours as needed for moderate pain.     prenatal multivitamin Tabs tablet  Take 1 tablet by mouth at bedtime.     ranitidine 150 MG tablet  Commonly known as:  ZANTAC  Take 150 mg by mouth 2 (two) times daily.        Wound Care: keep clean and dry / remove honeycomb POD 5 Postpartum Instructions: [facility] discharge booklet - instructions reviewed  Discharge to: Home  Follow up :  [facility] in 4-6 days for interval visit with nurse for BP recheck and incision check [facility] in 6 weeks for routine postpartum visit with Dr Billy Coast                Signed: [name] CNM, MSN, [facility] 11/09/2015, 10:24 AM

## 2015-11-09 NOTE — Progress Notes (Signed)
POSTOPERATIVE DAY # 2 S/P cesarean section-arrest of labor  S:         Reports feeling well- wants to go home             Tolerating po intake / no nausea / no vomiting / + flatus / + BM             No PIH symptoms             Bleeding is spotting             Pain controlled with motrin and percocet             Up ad lib / ambulatory/ voiding QS  Newborn breast feeding    O:  VS: BP 133/77 mmHg  Pulse 105  Temp(Src) 97.7 F (36.5 C) (Axillary)  Resp 18  Ht  (1.727 m)  Wt 138.347 kg (305 lb)  BMI 46.39 kg/m2  SpO2 98%  Breastfeeding? Unknown   LABS:               Recent Labs  11/08/15 0530  WBC 16.2*  HGB 9.8*  PLT 209               Bloodtype: --/--/O POS, O POS (01/25 0122)  Rubella: Immune (06/16 0000)                                             I&O: Intake/Output      01/27 0701 - 01/28 0700 01/28 0701 - 01/29 0700   P.O.     I.V. (mL/kg)     Total Intake(mL/kg)     Urine (mL/kg/hr) 1200 (0.4)    Other     Blood     Total Output 1200     Net -1200                       Physical Exam:             Alert and Oriented X3  Lungs: Clear and unlabored  Heart: regular rate and rhythm / no mumurs  Abdomen: soft, non-tender, non-distended, active BS             Fundus: firm, non-tender, Ueven             Dressing intact honeycomb - stained with blue marker ink              Incision:  approximated with suture / no erythema / no ecchymosis / no drainage  Perineum: intact with moderate edema  Lochia: light  Extremities: trace edema, no calf pain or tenderness, negative Homans  A:        POD # 2 S/P CS            Mild ABL anemia            BP stable / no PEC symptoms  P:        Routine postoperative care              DC home if newborn cleared by Peds for DC             [facility] booklet - instructions reviewed / signs to call - BP recheck at [facility] next week   [name] CNM, MSN, [facility] 11/09/2015, 9:10 AM

## 2015-11-09 NOTE — Lactation Note (Signed)
This note was copied from the chart of [facility] [employer]. Lactation Consultation Note  Parents state they are still having difficulty latching so recently have only been formula bottle feeding. Discussed supply and demand and the importance of establishing her milk supply. Encouraged mother to breastfeed before offering formula and call for help with latching if desired. Provider her with another #24 NS. Reminded her to post pump at least 4-6 x a day for 10-15 min with DEBP and give volume back to baby. Reviewed engorgement care and monitoring voids/stools.   Patient Name: Sarah Gibson [id number] Date: 11/09/2015 Reason for consult: Follow-up assessment   Maternal Data    Feeding Nipple Type: Slow - flow  LATCH Score/Interventions                      Lactation Tools Discussed/Used     Consult Status Consult Status: Complete    Hardie Pulley 11/09/2015, 12:24 PM

## 2015-11-14 ENCOUNTER — Ambulatory Visit (HOSPITAL_COMMUNITY): Payer: BLUE CROSS/BLUE SHIELD

## 2015-11-22 ENCOUNTER — Ambulatory Visit (HOSPITAL_COMMUNITY): Admission: RE | Admit: 2015-11-22 | Payer: BLUE CROSS/BLUE SHIELD | Source: Ambulatory Visit

## 2016-01-01 ENCOUNTER — Ambulatory Visit (INDEPENDENT_AMBULATORY_CARE_PROVIDER_SITE_OTHER): Payer: BLUE CROSS/BLUE SHIELD | Admitting: Neurology

## 2016-01-01 ENCOUNTER — Telehealth: Payer: Self-pay | Admitting: Neurology

## 2016-01-01 ENCOUNTER — Encounter: Payer: Self-pay | Admitting: Neurology

## 2016-01-01 VITALS — BP 122/65 | HR 70 | Ht 67.5 in | Wt 280.4 lb

## 2016-01-01 DIAGNOSIS — M79652 Pain in left thigh: Secondary | ICD-10-CM | POA: Diagnosis not present

## 2016-01-01 DIAGNOSIS — M545 Low back pain, unspecified: Secondary | ICD-10-CM

## 2016-01-01 DIAGNOSIS — R200 Anesthesia of skin: Secondary | ICD-10-CM

## 2016-01-01 DIAGNOSIS — M5416 Radiculopathy, lumbar region: Secondary | ICD-10-CM

## 2016-01-01 DIAGNOSIS — G8911 Acute pain due to trauma: Secondary | ICD-10-CM

## 2016-01-01 DIAGNOSIS — R29898 Other symptoms and signs involving the musculoskeletal system: Secondary | ICD-10-CM

## 2016-01-01 DIAGNOSIS — R208 Other disturbances of skin sensation: Secondary | ICD-10-CM

## 2016-01-01 NOTE — Patient Instructions (Signed)
Remember to drink plenty of fluid, eat healthy meals and do not skip any meals. Try to eat protein with a every meal and eat a healthy snack such as fruit or nuts in between meals. Try to keep a regular sleep-wake schedule and try to exercise daily, particularly in the form of walking, 20-30 minutes a day, if you can.   As far as diagnostic testing: emg/ncs, MRI lumbar spine and PT  I would like to see you back for emg/ncs, sooner if we need to. Please call us with any interim questions, concerns, problems, updates or refill requests.   Our phone number is [phone]. We also have an after hours call service for urgent matters and there is a physician on-call for urgent questions. For any emergencies you know to call 911 or go to the nearest emergency room

## 2016-01-01 NOTE — Telephone Encounter (Signed)
Called pt back. Advised per Dr Lucia GaskinsAhern its ok to do paperwork. She is already having them fax paperwork to [phone]. Advised she has up to 14 days to complete paperwork. We will contact her once finished. She verbalized understanding.

## 2016-01-01 NOTE — Progress Notes (Signed)
[facility]    Provider:  Dr Lucia Gaskins Referring Provider: Olivia Mackie, MD Primary Care Physician:  Lenoard Aden, MD  CC:  Left leg pain and radicular symptoms  HPI:  Sarah Gibson is a 25 y.o. female here as a referral from Dr. Billy Coast for pain in primarily the left leg after epidural on Janu 25th. Past medical history of depression and anxiety, morbid obesity, transient hypertension in third trimester. She had an emergency c-section. She didn't get past 4cm and didn't push. Baby's head was stuck in the birth canal. She was in a lot of pain after her c-section. After week 4-5 she noticed that the pain was in her back. She has pressure on the left side of the back that radiates into the hip and thigh to above the knee. Starts in the back and radiates to the the (points to the anterolateral) left thigh. Pulsing, sharp pain. Sometimes is dull. Sharp in the back and numbness. Discussed meralgia paresthetica. The pain in the back and the thigh pain don't always happen together. The pain hurts the most when she has been sitting up or standing for long periods. She was in the car 40 minutes and about 30 minutes later felt pain starting in the back. The leg pain not always with the the back pain. But sometimes she feels them together. No weakness. No changes in bowel or bladder. No other focal neurologic symptoms or complaints.  Reviewed notes, labs and imaging from outside physicians, which showed: Reviewed notes from [facility]. Patient's delivery date was 11/07/2015. She had a C-section. Birth weight was 9 lbs. 1 oz. Patient had antepartum transient hypertension of the third trimester which is resolved.  Nov 06 2015: CBC elevated white blood cells and anemia 9.8/28.1, CMP was unremarkable, RPR negative,  Review of Systems: Patient complains of symptoms per HPI as well as the following symptoms: Fatigue, cramps, aching muscles, memory loss, headache, anxiety, decreased  energy. Pertinent negatives per HPI. All others negative.   Social History   Social History  . Marital Status: Married    Spouse Name: Kurtis   . Number of Children: 1  . Years of Education: 141   Occupational History  . [employer]    Social History Main Topics  . Smoking status: Never Smoker   . Smokeless tobacco: Never Used  . Alcohol Use: No  . Drug Use: No  . Sexual Activity: Not on file   Other Topics Concern  . Not on file   Social History Narrative   Lives with spouse and infant Cherrie Gauze)   Caffeine use:  3-4 cups daily (soda/tea)    Family History  Problem Relation Age of Onset  . Hypertension Mother   . Diabetes Mother   . Depression Mother   . Hypertension Father   . Migraines Father   . Neuropathy Neg Hx     Past Medical History  Diagnosis Date  . Bell's palsy     AT AGE 63 YEARS  . MVP (mitral valve prolapse)   . Anxiety   . Pregnancy induced hypertension   . Arrhythmia     Past Surgical History  Procedure Laterality Date  . Wisdom tooth extraction  2014  . Tonsillectomy and adenoidectomy      AT AGE 9 YEARS  . Cesarean section N/A 11/07/2015    Procedure: CESAREAN SECTION;  Surgeon: Olivia Mackie, MD;  Location: [facility];  Service: Obstetrics;  Laterality: N/A;    Current Outpatient Prescriptions  Medication Sig Dispense Refill  . acetaminophen (TYLENOL) 500 MG tablet Take 1,000 mg by mouth every 6 (six) hours as needed for mild pain or headache.     . labetalol (NORMODYNE) 100 MG tablet Take 1 tablet (100 mg total) by mouth 2 (two) times daily. 60 tablet 12  . UNKNOWN TO PATIENT Take 1 tablet by mouth daily. Birth control- 28 day pack w/ 4 day placebo. Pt cannot remember name of medication    . ibuprofen (ADVIL,MOTRIN) 600 MG tablet Take 1 tablet (600 mg total) by mouth every 6 (six) hours. (Patient not taking: Reported on 01/01/2016) 30 tablet 0   No current facility-administered medications for this visit.    Allergies as of  01/01/2016 - Review Complete 01/01/2016  Allergen Reaction Noted  . Augmentin [amoxicillin-pot clavulanate] Nausea Only 05/24/2015    Vitals: BP 122/65 mmHg  Pulse 70  Ht 5' 7.5" (1.715 m)  Wt 280 lb 6.4 oz (127.189 kg)  BMI 43.24 kg/m2  SpO2 98% Last Weight:  Wt Readings from Last 1 Encounters:  01/01/16 280 lb 6.4 oz (127.189 kg)   Last Height:   Ht Readings from Last 1 Encounters:  01/01/16 5' 7.5" (1.715 m)   Physical exam: Exam: Gen: NAD, conversant, well nourised, obese, well groomed                     CV: RRR, no MRG. No Carotid Bruits. No peripheral edema, warm, nontender Eyes: Conjunctivae clear without exudates or hemorrhage  Neuro: Detailed Neurologic Exam  Speech:    Speech is normal; fluent and spontaneous with normal comprehension.  Cognition:    The patient is oriented to person, place, and time;     recent and remote memory intact;     language fluent;     normal attention, concentration,     fund of knowledge Cranial Nerves:    The pupils are equal, round, and reactive to light. The fundi are normal and spontaneous venous pulsations are present. Visual fields are full to finger confrontation. Extraocular movements are intact. Trigeminal sensation is intact and the muscles of mastication are normal. The face is symmetric. The palate elevates in the midline. Hearing intact. Voice is normal. Shoulder shrug is normal. The tongue has normal motion without fasciculations.   Coordination:    Normal finger to nose and heel to shin. Normal rapid alternating movements.   Gait:    Heel-toe and tandem gait are normal.   Motor Observation:    No asymmetry, no atrophy, and no involuntary movements noted. Tone:    Normal muscle tone.    Posture:    Posture is normal. normal erect    Strength: Left mild prox weakness with pain otherwise strength is V/V in the upper and lower limbs.      Sensation: intact to LT     Reflex Exam:  DTR's:    Deep tendon  reflexes in the upper and lower extremities are normal bilaterally.   Toes:    The toes are downgoing bilaterally.   Clonus:    Clonus is absent.       Assessment/Plan:  25 year old female with left-sided low back pain and left anterolateral thigh radiating pain which started about 4-5 weeks after C-section. Differential includes lumbar radiculopathy or low back pain with concomitant left meralgia paresthetica.  Possible meralgia paresthetica: weight loss, don't wear tight pants.  Possible lumbar radiculopathy: MRI of the lumbar spine and perform an EMG nerve conduction study. Recommend physical  therapy, weight loss if MRI of the lumbar spine and EMG nerve conduction study are unremarkable.   Naomie Dean, MD  [facility] [address] [place], [place] [address]  Phone [phone] Fax [phone]

## 2016-01-01 NOTE — Telephone Encounter (Signed)
Pt called wanting to make sure Dr Lucia GaskinsAhern wants her to stay out for another 2 weeks. If so, she can have disability paperwork faxed from [employer]. She sts today is the last day for short term for child birth so she really needs to know today. Please call asap.

## 2016-01-02 NOTE — Telephone Encounter (Signed)
Received paperwork from [employer].

## 2016-01-04 ENCOUNTER — Encounter: Payer: Self-pay | Admitting: Neurology

## 2016-01-04 DIAGNOSIS — M545 Low back pain, unspecified: Secondary | ICD-10-CM | POA: Insufficient documentation

## 2016-01-04 DIAGNOSIS — M79652 Pain in left thigh: Secondary | ICD-10-CM | POA: Insufficient documentation

## 2016-01-08 ENCOUNTER — Ambulatory Visit (INDEPENDENT_AMBULATORY_CARE_PROVIDER_SITE_OTHER): Payer: BLUE CROSS/BLUE SHIELD

## 2016-01-08 DIAGNOSIS — M545 Low back pain, unspecified: Secondary | ICD-10-CM

## 2016-01-08 DIAGNOSIS — R29898 Other symptoms and signs involving the musculoskeletal system: Secondary | ICD-10-CM | POA: Diagnosis not present

## 2016-01-08 DIAGNOSIS — R200 Anesthesia of skin: Secondary | ICD-10-CM

## 2016-01-08 DIAGNOSIS — M5416 Radiculopathy, lumbar region: Secondary | ICD-10-CM

## 2016-01-08 DIAGNOSIS — G8911 Acute pain due to trauma: Secondary | ICD-10-CM

## 2016-01-08 DIAGNOSIS — R208 Other disturbances of skin sensation: Secondary | ICD-10-CM | POA: Diagnosis not present

## 2016-01-08 NOTE — Telephone Encounter (Addendum)
Pt said [employer] called and advised paperwork has not been rec'd. Message relayed that paperwork had been rec'd and it could take up to 14 days to complete. Said she was not trying to [employer], just relaying the message.

## 2016-01-08 NOTE — Telephone Encounter (Signed)
Noted. Still working on paperwork. Will contact pt once finished.

## 2016-01-09 ENCOUNTER — Telehealth: Payer: Self-pay | Admitting: *Deleted

## 2016-01-09 ENCOUNTER — Telehealth: Payer: Self-pay | Admitting: Neurology

## 2016-01-09 DIAGNOSIS — Z0289 Encounter for other administrative examinations: Secondary | ICD-10-CM

## 2016-01-09 NOTE — Telephone Encounter (Signed)
-----   Message from Anson FretAntonia B Ahern, MD sent at 01/09/2016 12:22 PM EDT ----- MRI of the lumbar spine is normal thanks

## 2016-01-09 NOTE — Telephone Encounter (Signed)
Called pt and spoke to her about normal MRI lumbar spine per Dr Lucia GaskinsAhern. She verbalized understanding.

## 2016-01-09 NOTE — Telephone Encounter (Signed)
Spoke to patient and she has paid for her form . Abbie SonsGaynell has spoke to patient.

## 2016-01-14 ENCOUNTER — Encounter: Payer: Self-pay | Admitting: Neurology

## 2016-01-14 ENCOUNTER — Ambulatory Visit (INDEPENDENT_AMBULATORY_CARE_PROVIDER_SITE_OTHER): Payer: BLUE CROSS/BLUE SHIELD | Admitting: Neurology

## 2016-01-14 ENCOUNTER — Ambulatory Visit (INDEPENDENT_AMBULATORY_CARE_PROVIDER_SITE_OTHER): Payer: Self-pay | Admitting: Neurology

## 2016-01-14 DIAGNOSIS — Z0289 Encounter for other administrative examinations: Secondary | ICD-10-CM

## 2016-01-14 DIAGNOSIS — M5416 Radiculopathy, lumbar region: Secondary | ICD-10-CM | POA: Diagnosis not present

## 2016-01-14 DIAGNOSIS — M79605 Pain in left leg: Secondary | ICD-10-CM

## 2016-01-14 MED ORDER — LIDOCAINE 5 % EX PTCH: 3.0000 | MEDICATED_PATCH | CUTANEOUS | Status: DC

## 2016-01-14 NOTE — Progress Notes (Signed)
  [facility]    Provider:  Dr Lucia GaskinsAhern Referring Provider: Olivia Mackieaavon, Richard, MD Primary Care Physician:  Lenoard AdenAAVON,RICHARD J, MD  CC: Left leg pain and radicular symptoms  HPI: [name] is a 25 year old female with left-sided low back pain and left anterolateral thigh radiating pain which started about 4-5 weeks after C-section. Differential includes lumbar radiculopathy or low back pain with concomitant left meralgia paresthetica. MR of the lumbar spine did not show spinal stenosis or foraminal narrowing.  Summary  Nerve conduction studies were performed on the bilateral lower extremities:  The bilateral Peroneal motor nerves showed normal conductions with normal F Wave latencies The bilateral Tibial motor nerves showed normal conductions with normal F Wave latencies The bilateral Sural sensory nerve conductions were within normal limits The bilateral Superficial Peroneal sensory nerve conductions were within normal limits Bilateral H Reflexes showed normal latencies Bilateral saphenous sensory nerves were within normal limits. Attempted femoral motor bilaterally however technically difficult given patient's large body habitus.  EMG Needle study was performed on selected left lower extremity muscles:   The  gluteus maximus and gluteus medius, iliopsoas, biceps femoris (long head), vastus lateralis, Vastus Medialis, Anterior Tibialis, Medial Gastrocnemius, Extensor Hallucis Longus were within normal limits.  Conclusion: This is a normal study. No electrophysiologic evidence for mononeuropathy, polyneuropathy, lumbar radiculopathy. Recommended MRI of the pelvis, patient declines at this time. We'll try Lidoderm patches on the left leg for possible meralgia paresthetica.  Sarah DeanAntonia Doree Kuehne, MD  [facility] [address] [place], [place] [address]  Phone [phone] Fax [phone]

## 2016-01-15 ENCOUNTER — Telehealth: Payer: Self-pay | Admitting: *Deleted

## 2016-01-15 NOTE — Telephone Encounter (Signed)
Gave completed FMLA paperwork to Stanton KidneyDebra in medical records to send to [employer].

## 2016-01-15 NOTE — Telephone Encounter (Signed)
Patient form faxed to [facility] on 01/15/16.

## 2016-01-19 DIAGNOSIS — M79605 Pain in left leg: Secondary | ICD-10-CM | POA: Insufficient documentation

## 2016-01-19 NOTE — Progress Notes (Signed)
See procedure note.

## 2016-01-19 NOTE — Procedures (Signed)
[facility]    Provider:  Dr Lucia GaskinsAhern Referring Provider: Olivia Mackieaavon, Richard, MD Primary Care Physician:  Lenoard AdenAAVON,RICHARD J, MD  CC: Left leg pain and radicular symptoms  HPI: Sarah Gibson is a 25 year old female with left-sided low back pain and left anterolateral thigh radiating pain which started about 4-5 weeks after C-section. Differential includes lumbar radiculopathy or low back pain with concomitant left meralgia paresthetica. MR of the lumbar spine did not show spinal stenosis or foraminal narrowing.  Summary  Nerve conduction studies were performed on the bilateral lower extremities:  The bilateral Peroneal motor nerves showed normal conductions with normal F Wave latencies The bilateral Tibial motor nerves showed normal conductions with normal F Wave latencies The bilateral Sural sensory nerve conductions were within normal limits The bilateral Superficial Peroneal sensory nerve conductions were within normal limits Bilateral H Reflexes showed normal latencies Bilateral saphenous sensory nerves were within normal limits. Attempted femoral motor bilaterally however technically difficult given patient's large body habitus.  EMG Needle study was performed on selected left lower extremity muscles:   The  gluteus maximus and gluteus medius, iliopsoas, biceps femoris (long head), vastus lateralis, Vastus Medialis, Anterior Tibialis, Medial Gastrocnemius, Extensor Hallucis Longus, L4/L5/S1 paraspinals were within normal limits.  Conclusion: This is a normal study. No electrophysiologic evidence for mononeuropathy, polyneuropathy, left-sided lumbar radiculopathy. Recommended MRI of the pelvis, patient declines at this time. We'll try Lidoderm patches on the left leg for possible meralgia paresthetica.  Sarah DeanAntonia Ahern, MD  [facility] [address] [place], [place] [address]  Phone [phone] Fax [phone]

## 2018-06-24 LAB — OB RESULTS CONSOLE HEPATITIS B SURFACE ANTIGEN: Hepatitis B Surface Ag: NEGATIVE

## 2018-06-24 LAB — OB RESULTS CONSOLE ANTIBODY SCREEN: Antibody Screen: NEGATIVE

## 2018-06-24 LAB — OB RESULTS CONSOLE HIV ANTIBODY (ROUTINE TESTING): HIV: NONREACTIVE

## 2018-06-24 LAB — OB RESULTS CONSOLE RPR: RPR: NONREACTIVE

## 2018-06-24 LAB — OB RESULTS CONSOLE GC/CHLAMYDIA
Chlamydia: NEGATIVE
GC PROBE AMP, GENITAL: NEGATIVE

## 2018-06-24 LAB — OB RESULTS CONSOLE ABO/RH: RH Type: POSITIVE

## 2018-06-24 LAB — OB RESULTS CONSOLE RUBELLA ANTIBODY, IGM: Rubella: IMMUNE

## 2018-12-15 ENCOUNTER — Encounter (HOSPITAL_COMMUNITY): Payer: Self-pay

## 2018-12-15 ENCOUNTER — Other Ambulatory Visit: Payer: Self-pay

## 2018-12-15 ENCOUNTER — Inpatient Hospital Stay (HOSPITAL_COMMUNITY)
Admission: AD | Admit: 2018-12-15 | Discharge: 2018-12-15 | Disposition: A | Discharge status: Home / Self Care | Payer: BLUE CROSS/BLUE SHIELD | Attending: Obstetrics and Gynecology | Admitting: Obstetrics and Gynecology

## 2018-12-15 DIAGNOSIS — G43909 Migraine, unspecified, not intractable, without status migrainosus: Secondary | ICD-10-CM | POA: Diagnosis not present

## 2018-12-15 DIAGNOSIS — Z880 Allergy status to penicillin: Secondary | ICD-10-CM | POA: Diagnosis not present

## 2018-12-15 DIAGNOSIS — O368130 Decreased fetal movements, third trimester, not applicable or unspecified: Secondary | ICD-10-CM | POA: Diagnosis not present

## 2018-12-15 DIAGNOSIS — R030 Elevated blood-pressure reading, without diagnosis of hypertension: Secondary | ICD-10-CM | POA: Diagnosis present

## 2018-12-15 DIAGNOSIS — O26893 Other specified pregnancy related conditions, third trimester: Secondary | ICD-10-CM | POA: Insufficient documentation

## 2018-12-15 DIAGNOSIS — O99353 Diseases of the nervous system complicating pregnancy, third trimester: Secondary | ICD-10-CM | POA: Insufficient documentation

## 2018-12-15 DIAGNOSIS — R4701 Aphasia: Secondary | ICD-10-CM | POA: Insufficient documentation

## 2018-12-15 DIAGNOSIS — Z3A33 33 weeks gestation of pregnancy: Secondary | ICD-10-CM | POA: Diagnosis not present

## 2018-12-15 DIAGNOSIS — R202 Paresthesia of skin: Secondary | ICD-10-CM

## 2018-12-15 HISTORY — DX: Headache: R51

## 2018-12-15 HISTORY — DX: Headache, unspecified: R51.9

## 2018-12-15 LAB — COMPREHENSIVE METABOLIC PANEL
ALBUMIN: 2.6 g/dL — AB (ref 3.5–5.0)
ALK PHOS: 76 U/L (ref 38–126)
ALT: 15 U/L (ref 0–44)
AST: 22 U/L (ref 15–41)
Anion gap: 8 (ref 5–15)
CALCIUM: 8.5 mg/dL — AB (ref 8.9–10.3)
CO2: 18 mmol/L — AB (ref 22–32)
CREATININE: 0.46 mg/dL (ref 0.44–1.00)
Chloride: 109 mmol/L (ref 98–111)
GFR calc Af Amer: >60 mL/min (ref >60)
GFR calc non Af Amer: >60 mL/min (ref >60)
GLUCOSE: 82 mg/dL (ref 70–99)
Potassium: 3.8 mmol/L (ref 3.5–5.1)
SODIUM: 135 mmol/L (ref 135–145)
Total Bilirubin: 0.6 mg/dL (ref 0.3–1.2)
Total Protein: 5.5 g/dL — ABNORMAL LOW (ref 6.5–8.1)

## 2018-12-15 LAB — CBC
HCT: 36.9 % (ref 36.0–46.0)
HEMOGLOBIN: 12.6 g/dL (ref 12.0–15.0)
MCH: 29.9 pg (ref 26.0–34.0)
MCHC: 34.1 g/dL (ref 30.0–36.0)
MCV: 87.6 fL (ref 80.0–100.0)
NRBC: 0 % (ref 0.0–0.2)
Platelets: 233 10*3/uL (ref 150–400)
RBC: 4.21 MIL/uL (ref 3.87–5.11)
RDW: 12.1 % (ref 11.5–15.5)
WBC: 10.7 10*3/uL — ABNORMAL HIGH (ref 4.0–10.5)

## 2018-12-15 LAB — URINALYSIS, ROUTINE W REFLEX MICROSCOPIC
Bilirubin Urine: NEGATIVE
GLUCOSE, UA: NEGATIVE mg/dL
Hgb urine dipstick: NEGATIVE
Ketones, ur: NEGATIVE mg/dL
LEUKOCYTE UA: NEGATIVE
Nitrite: NEGATIVE
PROTEIN: NEGATIVE mg/dL
Specific Gravity, Urine: 1.005 (ref 1.005–1.030)
pH: 7 (ref 5.0–8.0)

## 2018-12-15 LAB — PROTEIN / CREATININE RATIO, URINE
Creatinine, Urine: 51.49 mg/dL
Total Protein, Urine: <6 mg/dL

## 2018-12-15 MED ORDER — BUTALBITAL-APAP-CAFFEINE 50-325-40 MG PO TABS
2.0000 | ORAL_TABLET | Freq: Once | ORAL | Status: AC
  Administered 2018-12-15: 2 via ORAL
  Filled 2018-12-15: qty 2

## 2018-12-15 NOTE — MAU Note (Signed)
Pt had a migraine around 7am this morning. She also experience slurred speech around 715 am and also had numbness in her hands and legs. BP was taken at work and was high. 140/100. Retaken and 138/96. Pt brought here by co-worker. Not feeling well in general. DFM also.

## 2018-12-15 NOTE — Progress Notes (Signed)
Rapid Response nurse Verlon Au called to evaluate patient.

## 2018-12-15 NOTE — MAU Provider Note (Signed)
History     CSN: [id number]  Arrival date and time: 12/15/18 5997   None     Chief Complaint  Patient presents with  . Hypertension  . Headache  . Aphasia  . Decreased Fetal Movement  . Numbness   Sarah Gibson is a 28 y.o. G2P1001 at [redacted]w[redacted]d who presents for Hypertension; Headache; Aphasia; Decreased Fetal Movement; and Numbness.  She states her migraine started "about 0710 and I noticed it because the vision always goes in my right eye."  She states she went to the nurse, at work, and requested a blood pressure check which was 140/100 and then 136/86 after sitting.  She states that around "0730 my speech started to diminish with the inability to form sentences" and then states that she started having bilateral numbness throughout her body, but "one area at a time starting with my hands."  She also endorses some nausea, but no vomiting.  No history of epilepsy, but reports history of bells palsy at age 73 years old.  Good fetal movement and denies vaginal discharge, bleeding, or LOF as well as cramping and/or contractions.  She reports GHTN with first pregnancy, but was also "100lbs heavier" and took labetalol.  One elevated bp in the office on "Feb 11th," but none since.       OB History    Gravida  2   Para  1   Term  1   Preterm      AB      Living  1     SAB      TAB      Ectopic      Multiple  0   Live Births  1           Past Medical History:  Diagnosis Date  . Anxiety   . Arrhythmia   . Bell's palsy    AT AGE 88 YEARS  . Headache   . MVP (mitral valve prolapse)   . Pregnancy induced hypertension     Past Surgical History:  Procedure Laterality Date  . CESAREAN SECTION N/A 11/07/2015   Procedure: CESAREAN SECTION;  Surgeon: Olivia Mackie, MD;  Location: [facility];  Service: Obstetrics;  Laterality: N/A;  . CHOLECYSTECTOMY    . LAPAROSCOPIC GASTRIC SLEEVE RESECTION    . TONSILLECTOMY AND ADENOIDECTOMY     AT AGE 34 YEARS  . WISDOM TOOTH  EXTRACTION  2014    Family History  Problem Relation Age of Onset  . Hypertension Mother   . Diabetes Mother   . Depression Mother   . Hypertension Father   . Migraines Father   . Neuropathy Neg Hx     Social History   Tobacco Use  . Smoking status: Never Smoker  . Smokeless tobacco: Never Used  Substance Use Topics  . Alcohol use: No    Alcohol/week: 0.0 standard drinks  . Drug use: No    Allergies:  Allergies  Allergen Reactions  . Augmentin [Amoxicillin-Pot Clavulanate] Nausea Only    Medications Prior to Admission  Medication Sig Dispense Refill Last Dose  . acetaminophen (TYLENOL) 500 MG tablet Take 1,000 mg by mouth every 6 (six) hours as needed for mild pain or headache.    Taking  . ibuprofen (ADVIL,MOTRIN) 600 MG tablet Take 1 tablet (600 mg total) by mouth every 6 (six) hours. (Patient not taking: Reported on 01/01/2016) 30 tablet 0 Not Taking  . labetalol (NORMODYNE) 100 MG tablet Take 1 tablet (100 mg total) by mouth  2 (two) times daily. 60 tablet 12 Taking  . lidocaine (LIDODERM) 5 % Place 3 patches onto the skin daily. Remove & Discard patch within 12 hours or as directed by MD 90 patch 11   . UNKNOWN TO PATIENT Take 1 tablet by mouth daily. Birth control- 28 day pack w/ 4 day placebo. Pt cannot remember name of medication   Taking    Review of Systems  Constitutional: Negative for chills and fever.  Eyes: Positive for visual disturbance.  Respiratory: Positive for shortness of breath.   Cardiovascular: Positive for chest pain (A little bit, but I have an arrythmia and mitral valve prolapse.).  Gastrointestinal: Positive for abdominal pain (More of a discomfort than a pain.) and nausea. Negative for vomiting.  Musculoskeletal: Negative for neck pain and neck stiffness.  Neurological: Positive for dizziness, speech difficulty, numbness and headaches. Negative for facial asymmetry.  Psychiatric/Behavioral: Positive for decreased concentration.   Physical  Exam   Blood pressure 130/86, pulse 100, temperature 97.9 F (36.6 C), temperature source Oral, resp. rate 16, SpO2 100 %, unknown if currently breastfeeding.   Vitals:   12/15/18 1046 12/15/18 1101 12/15/18 1116 12/15/18 1131  BP: 119/68 (!) 117/56 121/64 120/69  Pulse: 84 79 86 85  Resp:      Temp:      TempSrc:      SpO2:      Weight:         Physical Exam  Constitutional: She is oriented to person, place, and time. She appears well-developed and well-nourished. She is cooperative.  HENT:  Head: Normocephalic and atraumatic.  Eyes: Conjunctivae are normal.  Neck: Normal range of motion.  Cardiovascular: Normal rate and regular rhythm.  Respiratory: Effort normal and breath sounds normal.  GI: Soft. Bowel sounds are normal.  Musculoskeletal: Normal range of motion.  Neurological: She is alert and oriented to person, place, and time. She has normal strength. No cranial nerve deficit or sensory deficit.  Abnormal gait-shuffling, but improved upon discharge  Skin: Skin is warm and dry.  Psychiatric: She has a normal mood and affect. Her behavior is normal. Thought content normal.    Fetal Assessment 120 bpm, Mod Var, -Decels, +Accels Toco: None  MAU Course   Results for orders placed or performed during the hospital encounter of 12/15/18 (from the past 24 hour(s))  Urinalysis, Routine w reflex microscopic     Status: None   Collection Time: 12/15/18  9:25 AM  Result Value Ref Range   Color, Urine YELLOW YELLOW   APPearance CLEAR CLEAR   Specific Gravity, Urine 1.005 1.005 - 1.030   pH 7.0 5.0 - 8.0   Glucose, UA NEGATIVE NEGATIVE mg/dL   Hgb urine dipstick NEGATIVE NEGATIVE   Bilirubin Urine NEGATIVE NEGATIVE   Ketones, ur NEGATIVE NEGATIVE mg/dL   Protein, ur NEGATIVE NEGATIVE mg/dL   Nitrite NEGATIVE NEGATIVE   Leukocytes,Ua NEGATIVE NEGATIVE  Protein / creatinine ratio, urine     Status: None   Collection Time: 12/15/18  9:25 AM  Result Value Ref Range    Creatinine, Urine 51.49 mg/dL   Total Protein, Urine <6 mg/dL   Protein Creatinine Ratio        0.00 - 0.15 mg/mg[Cre]  CBC     Status: Abnormal   Collection Time: 12/15/18 10:14 AM  Result Value Ref Range   WBC 10.7 (H) 4.0 - 10.5 K/uL   RBC 4.21 3.87 - 5.11 MIL/uL   Hemoglobin 12.6 12.0 - 15.0 g/dL  HCT 36.9 36.0 - 46.0 %   MCV 87.6 80.0 - 100.0 fL   MCH 29.9 26.0 - 34.0 pg   MCHC 34.1 30.0 - 36.0 g/dL   RDW 78.2 95.6 - 21.3 %   Platelets 233 150 - 400 K/uL   nRBC 0.0 0.0 - 0.2 %  Comprehensive metabolic panel     Status: Abnormal   Collection Time: 12/15/18 10:14 AM  Result Value Ref Range   Sodium 135 135 - 145 mmol/L   Potassium 3.8 3.5 - 5.1 mmol/L   Chloride 109 98 - 111 mmol/L   CO2 18 (L) 22 - 32 mmol/L   Glucose, Bld 82 70 - 99 mg/dL   BUN <5 (L) 6 - 20 mg/dL   Creatinine, Ser 0.86 0.44 - 1.00 mg/dL   Calcium 8.5 (L) 8.9 - 10.3 mg/dL   Total Protein 5.5 (L) 6.5 - 8.1 g/dL   Albumin 2.6 (L) 3.5 - 5.0 g/dL   AST 22 15 - 41 U/L   ALT 15 0 - 44 U/L   Alkaline Phosphatase 76 38 - 126 U/L   Total Bilirubin 0.6 0.3 - 1.2 mg/dL   GFR calc non Af Amer >60 >60 mL/min   GFR calc Af Amer >60 >60 mL/min   Anion gap 8 5 - 15   No results found.  MDM Physical Exam Labs: CBC, CMP, PC Ratio Measure BPQ15 min EFM Pain Management  Assessment and Plan  28 year old G2P1001 at 33.3weeks Cat I FT Elevated BP Migraine DFM Numbness/Aphasia  -Exam findings discussed -Will perform PIH labs -Will offer Fiorcet for migraine  -Will await results   Follow Up (11:33 AM)  -NST Reactive -Labs return normal range -BP remain normotensive -Results discussed with patient. -No s/s of CN distress as reported upon arrival. -Informed of need to follow up in office on Monday. -Patient requests to go to office today for follow up and OOW note. -Instructed to contact office and schedule or talk with staff regarding OOW note. -Will give note today stating that she was seen. -PTL  Precautions given. -Patient agreeable to Fiorcet although she expresses concern regarding "reaction" to previous "migraine medication." -Will monitor and discharge as appropriate.  Follow Up (12:49 PM)  -Reports improvement with fiorcet dosing. -Instructed on need for follow up in office and recommendation for Neurology consult. -No other q/c. -Encouraged to call primary ob or return to MAU if symptoms worsen or with the onset of new symptoms. -Discharged to home in improved condition  Cherre Robins MSN, CNM 12/15/2018, 9:32 AM

## 2018-12-15 NOTE — Progress Notes (Signed)
RN Shanda Bumps called to evaluate pt for a potenetial HTN crisis associated with a headache. Pt with no neuro deficits noted. Advised to have Mrs. Deacon evaluated by MAU physician. BP 130/86, HR 100, RR 16, O2 100% RA  Pt reports having a migraine this am, "feeling bad" associated with tingling to her face, lips, BUE and BLE. States her speech was "off" and BP at work was 149/100   Please call for any further assistance. No interventions from RRT

## 2018-12-15 NOTE — Discharge Instructions (Signed)
Third Trimester of Pregnancy The third trimester is from week 28 through week 40 (months 7 through 9). The third trimester is a time when the unborn baby (fetus) is growing rapidly. At the end of the ninth month, the fetus is about 20 inches in length and weighs 6-10 pounds. Body changes during your third trimester Your body will continue to go through many changes during pregnancy. The changes vary from woman to woman. During the third trimester:  Your weight will continue to increase. You can expect to gain 25-35 pounds (11-16 kg) by the end of the pregnancy.  You may begin to get stretch marks on your hips, abdomen, and breasts.  You may urinate more often because the fetus is moving lower into your pelvis and pressing on your bladder.  You may develop or continue to have heartburn. This is caused by increased hormones that slow down muscles in the digestive tract.  You may develop or continue to have constipation because increased hormones slow digestion and cause the muscles that push waste through your intestines to relax.  You may develop hemorrhoids. These are swollen veins (varicose veins) in the rectum that can itch or be painful.  You may develop swollen, bulging veins (varicose veins) in your legs.  You may have increased body aches in the pelvis, back, or thighs. This is due to weight gain and increased hormones that are relaxing your joints.  You may have changes in your hair. These can include thickening of your hair, rapid growth, and changes in texture. Some women also have hair loss during or after pregnancy, or hair that feels dry or thin. Your hair will most likely return to normal after your baby is born.  Your breasts will continue to grow and they will continue to become tender. A yellow fluid (colostrum) may leak from your breasts. This is the first milk you are producing for your baby.  Your belly button may stick out.  You may notice more swelling in your hands,  face, or ankles.  You may have increased tingling or numbness in your hands, arms, and legs. The skin on your belly may also feel numb.  You may feel short of breath because of your expanding uterus.  You may have more problems sleeping. This can be caused by the size of your belly, increased need to urinate, and an increase in your body's metabolism.  You may notice the fetus "dropping," or moving lower in your abdomen (lightening).  You may have increased vaginal discharge.  You may notice your joints feel loose and you may have pain around your pelvic bone. What to expect at prenatal visits You will have prenatal exams every 2 weeks until week 36. Then you will have weekly prenatal exams. During a routine prenatal visit:  You will be weighed to make sure you and the baby are growing normally.  Your blood pressure will be taken.  Your abdomen will be measured to track your baby's growth.  The fetal heartbeat will be listened to.  Any test results from the previous visit will be discussed.  You may have a cervical check near your due date to see if your cervix has softened or thinned (effaced).  You will be tested for Group B streptococcus. This happens between 35 and 37 weeks. Your health care provider may ask you:  What your birth plan is.  How you are feeling.  If you are feeling the baby move.  If you have had any abnormal   symptoms, such as leaking fluid, bleeding, severe headaches, or abdominal cramping.  If you are using any tobacco products, including cigarettes, chewing tobacco, and electronic cigarettes.  If you have any questions. Other tests or screenings that may be performed during your third trimester include:  Blood tests that check for low iron levels (anemia).  Fetal testing to check the health, activity level, and growth of the fetus. Testing is done if you have certain medical conditions or if there are problems during the pregnancy.  Nonstress test  (NST). This test checks the health of your baby to make sure there are no signs of problems, such as the baby not getting enough oxygen. During this test, a belt is placed around your belly. The baby is made to move, and its heart rate is monitored during movement. What is false labor? False labor is a condition in which you feel small, irregular tightenings of the muscles in the womb (contractions) that usually go away with rest, changing position, or drinking water. These are called Braxton Hicks contractions. Contractions may last for hours, days, or even weeks before true labor sets in. If contractions come at regular intervals, become more frequent, increase in intensity, or become painful, you should see your health care provider. What are the signs of labor?  Abdominal cramps.  Regular contractions that start at 10 minutes apart and become stronger and more frequent with time.  Contractions that start on the top of the uterus and spread down to the lower abdomen and back.  Increased pelvic pressure and dull back pain.  A watery or bloody mucus discharge that comes from the vagina.  Leaking of amniotic fluid. This is also known as your "water breaking." It could be a slow trickle or a gush. Let your health care provider know if it has a color or strange odor. If you have any of these signs, call your health care provider right away, even if it is before your due date. Follow these instructions at home: Medicines  Follow your health care provider's instructions regarding medicine use. Specific medicines may be either safe or unsafe to take during pregnancy.  Take a prenatal vitamin that contains at least 600 micrograms (mcg) of folic acid.  If you develop constipation, try taking a stool softener if your health care provider approves. Eating and drinking   Eat a balanced diet that includes fresh fruits and vegetables, whole grains, good sources of protein such as meat, eggs, or tofu,  and low-fat dairy. Your health care provider will help you determine the amount of weight gain that is right for you.  Avoid raw meat and uncooked cheese. These carry germs that can cause birth defects in the baby.  If you have low calcium intake from food, talk to your health care provider about whether you should take a daily calcium supplement.  Eat four or five small meals rather than three large meals a day.  Limit foods that are high in fat and processed sugars, such as fried and sweet foods.  To prevent constipation: ? Drink enough fluid to keep your urine clear or pale yellow. ? Eat foods that are high in fiber, such as fresh fruits and vegetables, whole grains, and beans. Activity  Exercise only as directed by your health care provider. Most women can continue their usual exercise routine during pregnancy. Try to exercise for 30 minutes at least 5 days a week. Stop exercising if you experience uterine contractions.  Avoid heavy lifting.  Do   not exercise in extreme heat or humidity, or at high altitudes.  Wear low-heel, comfortable shoes.  Practice good posture.  You may continue to have sex unless your health care provider tells you otherwise. Relieving pain and discomfort  Take frequent breaks and rest with your legs elevated if you have leg cramps or low back pain.  Take warm sitz baths to soothe any pain or discomfort caused by hemorrhoids. Use hemorrhoid cream if your health care provider approves.  Wear a good support bra to prevent discomfort from breast tenderness.  If you develop varicose veins: ? Wear support pantyhose or compression stockings as told by your healthcare provider. ? Elevate your feet for 15 minutes, 3-4 times a day. Prenatal care  Write down your questions. Take them to your prenatal visits.  Keep all your prenatal visits as told by your health care provider. This is important. Safety  Wear your seat belt at all times when driving.  Make  a list of emergency phone numbers, including numbers for family, friends, the hospital, and police and fire departments. General instructions  Avoid cat litter boxes and soil used by cats. These carry germs that can cause birth defects in the baby. If you have a cat, ask someone to clean the litter box for you.  Do not travel far distances unless it is absolutely necessary and only with the approval of your health care provider.  Do not use hot tubs, steam rooms, or saunas.  Do not drink alcohol.  Do not use any products that contain nicotine or tobacco, such as cigarettes and e-cigarettes. If you need help quitting, ask your health care provider.  Do not use any medicinal herbs or unprescribed drugs. These chemicals affect the formation and growth of the baby.  Do not douche or use tampons or scented sanitary pads.  Do not cross your legs for long periods of time.  To prepare for the arrival of your baby: ? Take prenatal classes to understand, practice, and ask questions about labor and delivery. ? Make a trial run to the hospital. ? Visit the hospital and tour the maternity area. ? Arrange for maternity or paternity leave through employers. ? Arrange for family and friends to take care of pets while you are in the hospital. ? Purchase a rear-facing car seat and make sure you know how to install it in your car. ? Pack your hospital bag. ? Prepare the baby's nursery. Make sure to remove all pillows and stuffed animals from the baby's crib to prevent suffocation.  Visit your dentist if you have not gone during your pregnancy. Use a soft toothbrush to brush your teeth and be gentle when you floss. Contact a health care provider if:  You are unsure if you are in labor or if your water has broken.  You become dizzy.  You have mild pelvic cramps, pelvic pressure, or nagging pain in your abdominal area.  You have lower back pain.  You have persistent nausea, vomiting, or  diarrhea.  You have an unusual or bad smelling vaginal discharge.  You have pain when you urinate. Get help right away if:  Your water breaks before 37 weeks.  You have regular contractions less than 5 minutes apart before 37 weeks.  You have a fever.  You are leaking fluid from your vagina.  You have spotting or bleeding from your vagina.  You have severe abdominal pain or cramping.  You have rapid weight loss or weight gain.  You have   shortness of breath with chest pain.  You notice sudden or extreme swelling of your face, hands, ankles, feet, or legs.  Your baby makes fewer than 10 movements in 2 hours.  You have severe headaches that do not go away when you take medicine.  You have vision changes. Summary  The third trimester is from week 28 through week 40, months 7 through 9. The third trimester is a time when the unborn baby (fetus) is growing rapidly.  During the third trimester, your discomfort may increase as you and your baby continue to gain weight. You may have abdominal, leg, and back pain, sleeping problems, and an increased need to urinate.  During the third trimester your breasts will keep growing and they will continue to become tender. A yellow fluid (colostrum) may leak from your breasts. This is the first milk you are producing for your baby.  False labor is a condition in which you feel small, irregular tightenings of the muscles in the womb (contractions) that eventually go away. These are called Braxton Hicks contractions. Contractions may last for hours, days, or even weeks before true labor sets in.  Signs of labor can include: abdominal cramps; regular contractions that start at 10 minutes apart and become stronger and more frequent with time; watery or bloody mucus discharge that comes from the vagina; increased pelvic pressure and dull back pain; and leaking of amniotic fluid. This information is not intended to replace advice given to you by your  health care provider. Make sure you discuss any questions you have with your health care provider. Document Released: 09/22/2001 Document Revised: 11/03/2016 Document Reviewed: 11/03/2016 [employer] Interactive Patient Education  2019 [employer]  

## 2018-12-21 ENCOUNTER — Encounter: Payer: Self-pay | Admitting: Neurology

## 2018-12-21 ENCOUNTER — Ambulatory Visit: Payer: BLUE CROSS/BLUE SHIELD | Admitting: Neurology

## 2018-12-21 ENCOUNTER — Other Ambulatory Visit: Payer: Self-pay

## 2018-12-21 ENCOUNTER — Encounter: Payer: Self-pay | Admitting: *Deleted

## 2018-12-21 VITALS — BP 139/75 | HR 109 | Ht 68.0 in | Wt 223.0 lb

## 2018-12-21 DIAGNOSIS — R200 Anesthesia of skin: Secondary | ICD-10-CM | POA: Diagnosis not present

## 2018-12-21 DIAGNOSIS — R4701 Aphasia: Secondary | ICD-10-CM

## 2018-12-21 DIAGNOSIS — G441 Vascular headache, not elsewhere classified: Secondary | ICD-10-CM | POA: Diagnosis not present

## 2018-12-21 DIAGNOSIS — Z3A34 34 weeks gestation of pregnancy: Secondary | ICD-10-CM

## 2018-12-21 DIAGNOSIS — G08 Intracranial and intraspinal phlebitis and thrombophlebitis: Secondary | ICD-10-CM

## 2018-12-21 DIAGNOSIS — G43109 Migraine with aura, not intractable, without status migrainosus: Secondary | ICD-10-CM

## 2018-12-21 DIAGNOSIS — R29898 Other symptoms and signs involving the musculoskeletal system: Secondary | ICD-10-CM

## 2018-12-21 DIAGNOSIS — R42 Dizziness and giddiness: Secondary | ICD-10-CM

## 2018-12-21 MED ORDER — METOCLOPRAMIDE HCL 10 MG PO TABS: 10.0000 mg | ORAL_TABLET | Freq: Four times a day (QID) | ORAL | 6 refills | Status: DC | PRN

## 2018-12-21 NOTE — Progress Notes (Signed)
[facility]    Provider:  Dr Lucia Gaskins Referring Provider: Olivia Mackie, MD Primary Care Physician:  Olivia Mackie, MD  CC:  New request to be seen for migraines  This is a 28 year old who was seen in the past for lumbar radiculopathy.  She is here today as a new request from Dr. Billy Coast for migraines. Reviewed Dr. 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  She has a family history of migraines in her father.  She is currently on Fioricet, Ambien, Zoloft.  Her last appointment was December 15, 2018 and at that time she was pregnant.  She was referred here for migraines in pregnancy. Since then she has fogginess, difficulty speaking, words are coming out wrong. She feels dizzy as well.  Patient is here with her mother who also provides information.  She is [redacted] weeks pregnant.  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  She is here with her mother who also provides information. She started migraines when she was in 6th grade. She did well in regards to migraines with her first child. This pregnancy she is experiencing new symptoms with speech difficulties. Her migraines n the past typically start with an aura in the right eye and then follows with pain, light and sound sensitivity, nausea, pulsating/pounding. Sleep helps. This past Thursday she had a migraine and her BP was 140, she started to lose speech and cognitive abilities, couldn't form sentences, get words out correctly. She went to the ED, no imaging was completed, she felt better, monitored the baby. She also experienced numbness in her mouth and tongue and hands and arms and into her legs. Bilaterally. She had a bells palsy on the left at 13.   CMP showed BUN <5 and creatinine 0.46.  Reviewed notes from the ED: Patient was seen in the emergency room March 5 due to migraine.  She also experienced slurred speech and numbness in her  hands and legs.  Her blood pressure was elevated.  Brought to the emergency room.  Possible hypertensive crisis with a headache.  No neuro deficits were noted however.   HPI 3 years ago:  Meylin Visone is a 28 y.o. female here as a referral from Dr. Billy Coast for pain in primarily the left leg after epidural on Janu 25th. Past medical history of depression and anxiety, morbid obesity, transient hypertension in third trimester. She had an emergency c-section. She didn't get past 4cm and didn't push. Baby's head was stuck in the birth canal. She was in a lot of pain after her c-section. After week 4-5 she noticed that the pain was in her back. She has pressure on the left side of the back that radiates into the hip and thigh to above the knee. Starts in the back and radiates to the the (points to the anterolateral) left thigh. Pulsing, sharp pain. Sometimes is dull. Sharp in the back and numbness. Discussed meralgia paresthetica. The pain in the back and the thigh pain don't always happen together. The pain hurts the most when she has been sitting up or standing for long periods. She was in the car 40 minutes and about 30 minutes later felt pain starting in the back. The leg pain not always with the the back pain. But sometimes she feels them together. No weakness. No changes in bowel or bladder. No other focal neurologic symptoms or complaints.  Reviewed notes, labs and  imaging from outside physicians, which showed: Reviewed notes from [facility]. Patient's delivery date was 11/07/2015. She had a C-section. Birth weight was 9 lbs. 1 oz. Patient had antepartum transient hypertension of the third trimester which is resolved.  Nov 06 2015: CBC elevated white blood cells and anemia 9.8/28.1, CMP was unremarkable, RPR negative,  Review of Systems: Patient complains of symptoms per HPI as well as the following symptoms: Fatigue, cramps, aching muscles, memory loss, headache, anxiety, decreased energy.  Pertinent negatives per HPI. All others negative.   Social History   Socioeconomic History  . Marital status: Married    Spouse name: Kurtis   . Number of children: 1  . Years of education: 14  . Highest education level: Associate degree: academic program  Occupational History  . Occupation: [employer]  Social Needs  . Financial resource strain: Not on file  . Food insecurity:    Worry: Not on file    Inability: Not on file  . Transportation needs:    Medical: Not on file    Non-medical: Not on file  Tobacco Use  . Smoking status: Never Smoker  . Smokeless tobacco: Never Used  Substance and Sexual Activity  . Alcohol use: No    Alcohol/week: 0.0 standard drinks  . Drug use: No  . Sexual activity: Not on file  Lifestyle  . Physical activity:    Days per week: Not on file    Minutes per session: Not on file  . Stress: Not on file  Relationships  . Social connections:    Talks on phone: Not on file    Gets together: Not on file    Attends religious service: Not on file    Active member of club or organization: Not on file    Attends meetings of clubs or organizations: Not on file    Relationship status: Not on file  . Intimate partner violence:    Fear of current or ex partner: Not on file    Emotionally abused: Not on file    Physically abused: Not on file    Forced sexual activity: Not on file  Other Topics Concern  . Not on file  Social History Narrative   Lives with spouse and child Zoey   Caffeine use:  3-4 cups daily (soda/tea)    Family History  Problem Relation Age of Onset  . Hypertension Mother   . Diabetes Mother   . Depression Mother   . Hypertension Father   . Migraines Father   . Diabetes Maternal Uncle   . Neuropathy Neg Hx     Past Medical History:  Diagnosis Date  . Anxiety   . Arrhythmia   . Bell's palsy    AT AGE 7 YEARS  . Headache   . MVP (mitral valve prolapse)   . Pregnancy induced hypertension     Past Surgical  History:  Procedure Laterality Date  . CESAREAN SECTION N/A 11/07/2015   Procedure: CESAREAN SECTION;  Surgeon: Olivia Mackie, MD;  Location: [facility];  Service: Obstetrics;  Laterality: N/A;  . CHOLECYSTECTOMY    . LAPAROSCOPIC GASTRIC SLEEVE RESECTION    . TONSILLECTOMY AND ADENOIDECTOMY     AT AGE 14 YEARS  . WISDOM TOOTH EXTRACTION  2014    Current Outpatient Medications  Medication Sig Dispense Refill  . omeprazole (PRILOSEC) 40 MG capsule Take 40 mg by mouth daily.    . Prenatal Vit-Fe Fumarate-FA (PRENATAL MULTIVITAMIN) TABS tablet Take 1 tablet by mouth  at bedtime.    . sertraline (ZOLOFT) 50 MG tablet Take 50 mg by mouth at bedtime.    Marland Kitchen zolpidem (AMBIEN) 5 MG tablet Take 5 mg by mouth at bedtime as needed for sleep.    Marland Kitchen acetaminophen (TYLENOL) 500 MG tablet Take 1,000 mg by mouth every 6 (six) hours as needed for mild pain or headache.     . metoCLOPramide (REGLAN) 10 MG tablet Take 1 tablet (10 mg total) by mouth every 6 (six) hours as needed. Take for nausea or migraine. 30 tablet 6  . UNKNOWN TO PATIENT Take 1 tablet by mouth daily. Birth control- 28 day pack w/ 4 day placebo. Pt cannot remember name of medication     No current facility-administered medications for this visit.     Allergies as of 12/21/2018 - Review Complete 12/21/2018  Allergen Reaction Noted  . Sumatriptan succinate Swelling 04/29/2018  . Augmentin [amoxicillin-pot clavulanate] Nausea Only 05/24/2015  . Cat hair extract Itching 02/22/2017  . Other  12/21/2018    Vitals: BP 139/75 (BP Location: Right Arm, Patient Position: Sitting)   Pulse (!) 109   Ht  (1.727 m)   Wt 223 lb (101.2 kg)   BMI 33.91 kg/m  Last Weight:  Wt Readings from Last 1 Encounters:  12/21/18 223 lb (101.2 kg)   Last Height:   Ht Readings from Last 1 Encounters:  12/21/18  (1.727 m)   Physical exam: Exam: Gen: NAD, conversant, well nourised, well groomed                     CV: tachcardia, no MRG. No Carotid  Bruits. No peripheral edema, warm, nontender Eyes: Conjunctivae clear without exudates or hemorrhage  Neuro: Detailed Neurologic Exam  Speech:    Speech is normal; fluent and spontaneous with normal comprehension.  Cognition:    The patient is oriented to person, place, and time;     recent and remote memory intact;     language fluent;     normal attention, concentration,     fund of knowledge Cranial Nerves:    The pupils are equal, round, and reactive to light. ONH elevation?  Visual fields are full to finger confrontation. Extraocular movements are intact. Trigeminal sensation is intact and the muscles of mastication are normal. ild left NL flattening and retraction of the left lid (hx of Bell's palsy). The palate elevates in the midline. Hearing intact. Voice is normal. Shoulder shrug is normal. The tongue has normal motion without fasciculations.   Coordination:    Normal finger to nose and heel to shin. Normal rapid alternating movements.   Gait: Lumbar  lordosis and wide based due to 34-weeks pregnant  Motor Observation:    No asymmetry, no atrophy, and no involuntary movements noted. Tone:    Normal muscle tone.    Posture:    Posture is normal. normal erect    Strength: Left LE 4/5 prox weakness otherwise strength is V/V in the upper and lower limbs.      Sensation: intact to LT     Reflex Exam:  DTR's:    Deep tendon reflexes in the upper and lower extremities are normal bilaterally.   Toes:    The toes are downgoing bilaterally.   Clonus:    Clonus is absent.       Assessment/Plan: This is a 28 year old who is [redacted] weeks pregnant with concerning symptoms of headache, dizziness, aphasia, numbness as well as focal weakness on  neurologic exam.  Need to evaluate for etiologies such as stroke, cerebral venous thrombosis especially given focal findings on exam, increased risk of stroke in patients with migraine aura and increased risk of thrombosis in  pregnancy.  - MRI of the brain and MRV of the head due to above, no contrast due to pregnancy  - migraine:  Reglan(Metoclopramide) as needed for nausea or migraine - can take it with tylenol. No medication is entirely safe in pregnancy. Discuss with OBGYN.   - During lactation, discuss using Reglan (Metoclopramide) with pediatrician.  Although most studies have found no adverse effects in breastfed infants during maternal metoclopramide use, many did not adequately observe for side effects.   - she is seeing OBGYN today, discuss reglan. Watch for pre-eclampsia.  Discussed: There are limited treatments in pregnancy and all have risks. Other interventions to try and decreased the use of medication for headache/migraine include:  Cool Compress. Lie down and place a cool compress on your head.  Avoid headache triggers. If certain foods or odors seem to have triggered your migraines in the past, avoid them. A headache diary might help you identify triggers.  Include physical activity in your daily routine. Try a daily walk or other moderate aerobic exercise.  Manage stress. Find healthy ways to cope with the stressors, such as delegating tasks on your to-do list.  Practice relaxation techniques. Try deep breathing, yoga, massage and visualization.  Eat regularly. Eating regularly scheduled meals and maintaining a healthy diet might help prevent headaches. Also, drink plenty of fluids.  Follow a regular sleep schedule. Sleep deprivation might contribute to headaches  Consider biofeedback. With this mind-body technique, you learn to control certain bodily functions - such as muscle tension, heart rate and blood pressure - to prevent headaches or reduce headache pain.  Headaches during pregnancy are common. However, if you develop a severe headache or a headache that doesn't go away, call your health care provider. Severe headaches can be a sign of a pregnancy complication (stressed, patient  acknowledged)  Discussed: There is increased risk for stroke in women with migraine with aura and a  Contraindication for the combined contraceptive pill for use by women who have migraine with aura, which is in line with World Health Organisation recommendations. The risk for women with migraine without aura is lower and other risk factors like smoking are far more likely to increase stroke risk than migraine. There is a recommendation for no smoking and for the use of low estrogen or progestogen only pills particularly for women with migraine with aura. It is important however that women with migraine who are taking the pill do not decide to suddenly stop taking it without discussing this with their doctor. Please discuss with her OB/GYN.       Naomie Dean, MD  [facility] [address] [place], [place] [address]  Phone [phone] Fax [phone]

## 2018-12-21 NOTE — Patient Instructions (Addendum)
- MRI of the brain and MRV of the head (NO CONTRAST) - Reglan(Metoclopramide) as needed for nausea or migraine - can take it with tylenol. No medication is entirely safe in pregnancy. Discuss with OBGYN.  - During lactation, discuss using Reglan (Metoclopramide) with pediatrician.  Although most studies have found no adverse effects in breastfed infants during maternal metoclopramide use, many did not adequately observe for side effects.  There is increased risk for stroke in women with migraine with aura and a  Contraindication for the combined contraceptive pill for use by women who have migraine with aura, which is in line with World Health Organisation recommendations. The risk for women with migraine without aura is lower and other risk factors like smoking are far more likely to increase stroke risk than migraine. There is a recommendation for no smoking and for the use of low estrogen or progestogen only pills particularly for women with migraine with aura. It is important however that women with migraine who are taking the pill do not decide to suddenly stop taking it without discussing this with their doctor. Please discuss with her OB/GYN.  Metoclopramide tablets What is this medicine? METOCLOPRAMIDE (met oh kloe PRA mide) is used to treat the symptoms of gastroesophageal reflux disease (GERD) like heartburn. It is also used to treat people with slow emptying of the stomach and intestinal tract. This medicine may be used for other purposes; ask your health care provider or pharmacist if you have questions. COMMON BRAND NAME(S): Reglan What should I tell my health care provider before I take this medicine? They need to know if you have any of these conditions: -breast cancer -depression -diabetes -heart failure -high blood pressure -kidney disease -liver disease -Parkinson's disease or a movement disorder -pheochromocytoma -seizures -stomach obstruction, bleeding, or perforation -an  unusual or allergic reaction to metoclopramide, procainamide, sulfites, other medicines, foods, dyes, or preservatives -pregnant or trying to get pregnant -breast-feeding How should I use this medicine? Take this medicine by mouth with a glass of water. Follow the directions on the prescription label. Take this medicine on an empty stomach, about 30 minutes before eating. Take your doses at regular intervals. Do not take your medicine more often than directed. Do not stop taking except on the advice of your doctor or health care professional. A special MedGuide will be given to you by the pharmacist with each prescription and refill. Be sure to read this information carefully each time. Talk to your pediatrician regarding the use of this medicine in children. Special care may be needed. Overdosage: If you think you have taken too much of this medicine contact a poison control center or emergency room at once. NOTE: This medicine is only for you. Do not share this medicine with others. What if I miss a dose? If you miss a dose, take it as soon as you can. If it is almost time for your next dose, take only that dose. Do not take double or extra doses. What may interact with this medicine? -acetaminophen -cyclosporine -digoxin -medicines for blood pressure -medicines for diabetes, including insulin -medicines for hay fever and other allergies -medicines for depression, especially a Monoamine Oxidase Inhibitor (MAOI) -medicines for Parkinson's disease, like levodopa -medicines for sleep or for pain -quinidine -tetracycline This list may not describe all possible interactions. Give your health care provider a list of all the medicines, herbs, non-prescription drugs, or dietary supplements you use. Also tell them if you smoke, drink alcohol, or use illegal drugs. Some  items may interact with your medicine. What should I watch for while using this medicine? It may take a few weeks for your stomach  condition to start to get better. However, do not take this medicine for longer than 12 weeks. The longer you take this medicine, and the more you take it, the greater your chances are of developing serious side effects. If you are an elderly patient, a female patient, or you have diabetes, you may be at an increased risk for side effects from this medicine. Contact your doctor immediately if you start having movements you cannot control such as lip smacking, rapid movements of the tongue, involuntary or uncontrollable movements of the eyes, head, arms and legs, or muscle twitches and spasms. Patients and their families should watch out for worsening depression or thoughts of suicide. Also watch out for any sudden or severe changes in feelings such as feeling anxious, agitated, panicky, irritable, hostile, aggressive, impulsive, severely restless, overly excited and hyperactive, or not being able to sleep. If this happens, especially at the beginning of treatment or after a change in dose, call your doctor. Do not treat yourself for high fever. Ask your doctor or health care professional for advice. You may get drowsy or dizzy. Do not drive, use machinery, or do anything that needs mental alertness until you know how this drug affects you. Do not stand or sit up quickly, especially if you are an older patient. This reduces the risk of dizzy or fainting spells. Alcohol can make you more drowsy and dizzy. Avoid alcoholic drinks. What side effects may I notice from receiving this medicine? Side effects that you should report to your doctor or health care professional as soon as possible: -allergic reactions like skin rash, itching or hives, swelling of the face, lips, or tongue -abnormal production of milk in females -breast enlargement in both males and females -change in the way you walk -difficulty moving, speaking or swallowing -drooling, lip smacking, or rapid movements of the tongue -excessive  sweating -fever -involuntary or uncontrollable movements of the eyes, head, arms and legs -irregular heartbeat or palpitations -muscle twitches and spasms -unusually weak or tired Side effects that usually do not require medical attention (report to your doctor or health care professional if they continue or are bothersome): -change in sex drive or performance -depressed mood -diarrhea -difficulty sleeping -headache -menstrual changes -restless or nervous This list may not describe all possible side effects. Call your doctor for medical advice about side effects. You may report side effects to FDA at 1-800-FDA-1088. Where should I keep my medicine? Keep out of the reach of children. Store at room temperature between 20 and 25 degrees C (68 and 77 degrees F). Protect from light. Keep container tightly closed. Throw away any unused medicine after the expiration date. NOTE: This sheet is a summary. It may not cover all possible information. If you have questions about this medicine, talk to your doctor, pharmacist, or health care provider.  2019 Elsevier/Gold Standard (2016-07-15 15:13:45)

## 2018-12-26 ENCOUNTER — Telehealth: Payer: Self-pay | Admitting: Neurology

## 2018-12-26 NOTE — Telephone Encounter (Signed)
MR Brain wo contrast & MRV Head wo contrast Dr. Valaria Good Auth: NPR Ref # [id number]. Patient is scheduled at [facility] for 12/28/18.

## 2018-12-28 ENCOUNTER — Other Ambulatory Visit: Payer: Self-pay

## 2018-12-28 ENCOUNTER — Ambulatory Visit: Payer: BLUE CROSS/BLUE SHIELD

## 2018-12-28 DIAGNOSIS — R200 Anesthesia of skin: Secondary | ICD-10-CM

## 2018-12-28 DIAGNOSIS — R4701 Aphasia: Secondary | ICD-10-CM

## 2018-12-28 DIAGNOSIS — Z3A34 34 weeks gestation of pregnancy: Secondary | ICD-10-CM

## 2018-12-28 DIAGNOSIS — G441 Vascular headache, not elsewhere classified: Secondary | ICD-10-CM

## 2018-12-28 DIAGNOSIS — R29898 Other symptoms and signs involving the musculoskeletal system: Secondary | ICD-10-CM

## 2018-12-28 DIAGNOSIS — G08 Intracranial and intraspinal phlebitis and thrombophlebitis: Secondary | ICD-10-CM

## 2018-12-28 DIAGNOSIS — R42 Dizziness and giddiness: Secondary | ICD-10-CM

## 2018-12-28 NOTE — Telephone Encounter (Signed)
Patient was unable to have her MRI due she starting to get cold sweats.. I rescheduled her for 02/07/19 after she has the baby.

## 2018-12-30 ENCOUNTER — Other Ambulatory Visit: Payer: Self-pay | Admitting: Obstetrics and Gynecology

## 2019-01-11 ENCOUNTER — Encounter (HOSPITAL_COMMUNITY): Payer: Self-pay

## 2019-01-11 NOTE — Patient Instructions (Addendum)
Sarah Gibson  01/11/2019   Your procedure is scheduled on:  01/23/2019  Arrive at 0730 at [facility] on [address] at [facility] and [employer]. You are invited to use the FREE valet parking or use the Visitor's parking deck.  Pick up the phone at the desk and dial [phone].  Call this number if you have problems the morning of surgery: [phone]  Remember:   Do not eat food:(After Midnight) Desps de medianoche.  Do not drink clear liquids: (After Midnight) Desps de medianoche.  Take these medicines the morning of surgery with A SIP OF WATER:  Take zoloft.  May take Prilosec   Do not wear jewelry, make-up or nail polish.  Do not wear lotions, powders, or perfumes. Do not wear deodorant.  Do not shave 48 hours prior to surgery.  Do not bring valuables to the hospital.  [facility] is not   responsible for any belongings or valuables brought to the hospital.  Contacts, dentures or bridgework may not be worn into surgery.  Leave suitcase in the car. After surgery it may be brought to your room.  For patients admitted to the hospital, checkout time is 11:00 AM the day of              discharge.      Please read over the following fact sheets that you were given:     Preparing for Surgery

## 2019-01-20 ENCOUNTER — Encounter (HOSPITAL_COMMUNITY)
Admission: RE | Admit: 2019-01-20 | Discharge: 2019-01-20 | Disposition: A | Discharge status: Home / Self Care | Payer: BLUE CROSS/BLUE SHIELD | Source: Ambulatory Visit

## 2019-01-23 ENCOUNTER — Inpatient Hospital Stay (HOSPITAL_COMMUNITY): Payer: BLUE CROSS/BLUE SHIELD | Admitting: Certified Registered Nurse Anesthetist

## 2019-01-23 ENCOUNTER — Inpatient Hospital Stay (HOSPITAL_COMMUNITY)
Admission: RE | Admit: 2019-01-23 | Discharge: 2019-01-25 | DRG: 786 | Disposition: A | Discharge status: Home / Self Care | Payer: BLUE CROSS/BLUE SHIELD | Attending: Obstetrics and Gynecology | Admitting: Obstetrics and Gynecology

## 2019-01-23 ENCOUNTER — Encounter (HOSPITAL_COMMUNITY): Payer: Self-pay | Admitting: *Deleted

## 2019-01-23 ENCOUNTER — Encounter (HOSPITAL_COMMUNITY)
Admission: RE | Disposition: A | Discharge status: Home / Self Care | Payer: Self-pay | Source: Home / Self Care | Attending: Obstetrics and Gynecology

## 2019-01-23 ENCOUNTER — Other Ambulatory Visit: Payer: Self-pay

## 2019-01-23 DIAGNOSIS — O34211 Maternal care for low transverse scar from previous cesarean delivery: Principal | ICD-10-CM | POA: Diagnosis present

## 2019-01-23 DIAGNOSIS — D62 Acute posthemorrhagic anemia: Secondary | ICD-10-CM | POA: Diagnosis not present

## 2019-01-23 DIAGNOSIS — O99844 Bariatric surgery status complicating childbirth: Secondary | ICD-10-CM | POA: Diagnosis present

## 2019-01-23 DIAGNOSIS — I341 Nonrheumatic mitral (valve) prolapse: Secondary | ICD-10-CM | POA: Diagnosis present

## 2019-01-23 DIAGNOSIS — O9962 Diseases of the digestive system complicating childbirth: Secondary | ICD-10-CM | POA: Diagnosis present

## 2019-01-23 DIAGNOSIS — Z3A39 39 weeks gestation of pregnancy: Secondary | ICD-10-CM | POA: Diagnosis not present

## 2019-01-23 DIAGNOSIS — O9081 Anemia of the puerperium: Secondary | ICD-10-CM | POA: Diagnosis not present

## 2019-01-23 DIAGNOSIS — O99344 Other mental disorders complicating childbirth: Secondary | ICD-10-CM | POA: Diagnosis present

## 2019-01-23 DIAGNOSIS — L259 Unspecified contact dermatitis, unspecified cause: Secondary | ICD-10-CM | POA: Diagnosis present

## 2019-01-23 DIAGNOSIS — F419 Anxiety disorder, unspecified: Secondary | ICD-10-CM | POA: Diagnosis present

## 2019-01-23 DIAGNOSIS — F329 Major depressive disorder, single episode, unspecified: Secondary | ICD-10-CM | POA: Diagnosis present

## 2019-01-23 DIAGNOSIS — O9942 Diseases of the circulatory system complicating childbirth: Secondary | ICD-10-CM | POA: Diagnosis present

## 2019-01-23 DIAGNOSIS — O9972 Diseases of the skin and subcutaneous tissue complicating childbirth: Secondary | ICD-10-CM | POA: Diagnosis present

## 2019-01-23 DIAGNOSIS — O34219 Maternal care for unspecified type scar from previous cesarean delivery: Secondary | ICD-10-CM | POA: Diagnosis present

## 2019-01-23 DIAGNOSIS — K219 Gastro-esophageal reflux disease without esophagitis: Secondary | ICD-10-CM | POA: Diagnosis present

## 2019-01-23 DIAGNOSIS — Z98891 History of uterine scar from previous surgery: Secondary | ICD-10-CM

## 2019-01-23 LAB — COMPREHENSIVE METABOLIC PANEL
ALT: 16 U/L (ref 0–44)
AST: 23 U/L (ref 15–41)
Albumin: 2.7 g/dL — ABNORMAL LOW (ref 3.5–5.0)
Alkaline Phosphatase: 112 U/L (ref 38–126)
Anion gap: 11 (ref 5–15)
BUN: 5 mg/dL — ABNORMAL LOW (ref 6–20)
CO2: 19 mmol/L — ABNORMAL LOW (ref 22–32)
Calcium: 8.9 mg/dL (ref 8.9–10.3)
Chloride: 107 mmol/L (ref 98–111)
Creatinine, Ser: 0.48 mg/dL (ref 0.44–1.00)
GFR calc Af Amer: >60 mL/min (ref >60)
GFR calc non Af Amer: >60 mL/min (ref >60)
Glucose, Bld: 81 mg/dL (ref 70–99)
Potassium: 3.7 mmol/L (ref 3.5–5.1)
Sodium: 137 mmol/L (ref 135–145)
Total Bilirubin: 0.7 mg/dL (ref 0.3–1.2)
Total Protein: 6 g/dL — ABNORMAL LOW (ref 6.5–8.1)

## 2019-01-23 LAB — CBC
HCT: 40.7 % (ref 36.0–46.0)
Hemoglobin: 14.2 g/dL (ref 12.0–15.0)
MCH: 30 pg (ref 26.0–34.0)
MCHC: 34.9 g/dL (ref 30.0–36.0)
MCV: 85.9 fL (ref 80.0–100.0)
Platelets: 240 K/uL (ref 150–400)
RBC: 4.74 MIL/uL (ref 3.87–5.11)
RDW: 12.6 % (ref 11.5–15.5)
WBC: 11.6 K/uL — ABNORMAL HIGH (ref 4.0–10.5)
nRBC: 0 % (ref 0.0–0.2)

## 2019-01-23 LAB — RPR: RPR Ser Ql: NONREACTIVE

## 2019-01-23 LAB — ABO/RH: ABO/RH(D): O POS

## 2019-01-23 LAB — TYPE AND SCREEN
ABO/RH(D): O POS
Antibody Screen: NEGATIVE

## 2019-01-23 SURGERY — Surgical Case
Anesthesia: Spinal | Site: Abdomen | Wound class: Clean Contaminated

## 2019-01-23 MED ORDER — BUPIVACAINE IN DEXTROSE 0.75-8.25 % IT SOLN
INTRATHECAL | Status: DC | PRN
  Administered 2019-01-23: 1.7 mL via INTRATHECAL

## 2019-01-23 MED ORDER — BUPIVACAINE HCL (PF) 0.25 % IJ SOLN
INTRAMUSCULAR | Status: DC | PRN
  Administered 2019-01-23: 20 mL

## 2019-01-23 MED ORDER — SODIUM CHLORIDE 0.9 % IV SOLN
INTRAVENOUS | Status: DC | PRN
  Administered 2019-01-23: 40 [IU] via INTRAVENOUS

## 2019-01-23 MED ORDER — PRENATAL MULTIVITAMIN CH
1.0000 | ORAL_TABLET | Freq: Every day | ORAL | Status: DC
  Administered 2019-01-24: 13:00:00 1 via ORAL
  Filled 2019-01-23: qty 1

## 2019-01-23 MED ORDER — ZOLPIDEM TARTRATE 5 MG PO TABS: 5.0000 mg | ORAL_TABLET | Freq: Every evening | ORAL | Status: DC | PRN

## 2019-01-23 MED ORDER — SODIUM CHLORIDE 0.9% FLUSH: 3.0000 mL | INTRAVENOUS | Status: DC | PRN

## 2019-01-23 MED ORDER — ONDANSETRON HCL 4 MG/2ML IJ SOLN
INTRAMUSCULAR | Status: AC
  Filled 2019-01-23: qty 2

## 2019-01-23 MED ORDER — SENNOSIDES-DOCUSATE SODIUM 8.6-50 MG PO TABS
2.0000 | ORAL_TABLET | ORAL | Status: DC
  Administered 2019-01-23 – 2019-01-24 (×2): 2 via ORAL
  Filled 2019-01-23 (×2): qty 2

## 2019-01-23 MED ORDER — WITCH HAZEL-GLYCERIN EX PADS: 1.0000 "application " | MEDICATED_PAD | CUTANEOUS | Status: DC | PRN

## 2019-01-23 MED ORDER — LACTATED RINGERS IV SOLN
INTRAVENOUS | Status: DC
  Administered 2019-01-23: 20:00:00 via INTRAVENOUS

## 2019-01-23 MED ORDER — SODIUM CHLORIDE 0.9 % IV SOLN
INTRAVENOUS | Status: DC | PRN
  Administered 2019-01-23: 10:00:00 via INTRAVENOUS

## 2019-01-23 MED ORDER — SIMETHICONE 80 MG PO CHEW
80.0000 mg | CHEWABLE_TABLET | Freq: Three times a day (TID) | ORAL | Status: DC
  Administered 2019-01-23 – 2019-01-24 (×5): 80 mg via ORAL
  Filled 2019-01-23 (×6): qty 1

## 2019-01-23 MED ORDER — CEFAZOLIN SODIUM-DEXTROSE 2-4 GM/100ML-% IV SOLN
2.0000 g | INTRAVENOUS | Status: AC
  Administered 2019-01-23: 10:00:00 2 g via INTRAVENOUS

## 2019-01-23 MED ORDER — BUPIVACAINE HCL (PF) 0.25 % IJ SOLN
INTRAMUSCULAR | Status: AC
  Filled 2019-01-23: qty 20

## 2019-01-23 MED ORDER — METHYLERGONOVINE MALEATE 0.2 MG PO TABS: 0.2000 mg | ORAL_TABLET | ORAL | Status: DC | PRN

## 2019-01-23 MED ORDER — PANTOPRAZOLE SODIUM 40 MG PO TBEC
80.0000 mg | DELAYED_RELEASE_TABLET | Freq: Every day | ORAL | Status: DC
  Administered 2019-01-23 – 2019-01-24 (×2): 80 mg via ORAL
  Filled 2019-01-23 (×2): qty 2

## 2019-01-23 MED ORDER — IBUPROFEN 800 MG PO TABS
800.0000 mg | ORAL_TABLET | Freq: Three times a day (TID) | ORAL | Status: DC
  Administered 2019-01-23 – 2019-01-25 (×5): 800 mg via ORAL
  Filled 2019-01-23 (×5): qty 1

## 2019-01-23 MED ORDER — DIBUCAINE (PERIANAL) 1 % EX OINT: 1.0000 "application " | TOPICAL_OINTMENT | CUTANEOUS | Status: DC | PRN

## 2019-01-23 MED ORDER — SODIUM CHLORIDE (PF) 0.9 % IJ SOLN
INTRAMUSCULAR | Status: AC
  Filled 2019-01-23: qty 50

## 2019-01-23 MED ORDER — NALBUPHINE HCL 10 MG/ML IJ SOLN: 5.0000 mg | INTRAMUSCULAR | Status: DC | PRN

## 2019-01-23 MED ORDER — COCONUT OIL OIL
1.0000 "application " | TOPICAL_OIL | Status: DC | PRN
  Administered 2019-01-24: 1 via TOPICAL

## 2019-01-23 MED ORDER — ONDANSETRON HCL 4 MG/2ML IJ SOLN
INTRAMUSCULAR | Status: DC | PRN
  Administered 2019-01-23: 4 mg via INTRAVENOUS

## 2019-01-23 MED ORDER — METHYLERGONOVINE MALEATE 0.2 MG/ML IJ SOLN: 0.2000 mg | INTRAMUSCULAR | Status: DC | PRN

## 2019-01-23 MED ORDER — LACTATED RINGERS IV SOLN
INTRAVENOUS | Status: DC
  Administered 2019-01-23 (×2): via INTRAVENOUS

## 2019-01-23 MED ORDER — FENTANYL CITRATE (PF) 100 MCG/2ML IJ SOLN
INTRAMUSCULAR | Status: AC
  Filled 2019-01-23: qty 2

## 2019-01-23 MED ORDER — DIPHENHYDRAMINE HCL 25 MG PO CAPS: 25.0000 mg | ORAL_CAPSULE | ORAL | Status: DC | PRN

## 2019-01-23 MED ORDER — NALBUPHINE HCL 10 MG/ML IJ SOLN: 5.0000 mg | Freq: Once | INTRAMUSCULAR | Status: DC | PRN

## 2019-01-23 MED ORDER — TETANUS-DIPHTH-ACELL PERTUSSIS 5-2.5-18.5 LF-MCG/0.5 IM SUSP: 0.5000 mL | Freq: Once | INTRAMUSCULAR | Status: DC

## 2019-01-23 MED ORDER — PHENYLEPHRINE HCL-NACL 20-0.9 MG/250ML-% IV SOLN
INTRAVENOUS | Status: DC | PRN
  Administered 2019-01-23: 60 ug/min via INTRAVENOUS

## 2019-01-23 MED ORDER — MORPHINE SULFATE (PF) 0.5 MG/ML IJ SOLN
INTRAMUSCULAR | Status: DC | PRN
  Administered 2019-01-23: .15 mg via INTRATHECAL

## 2019-01-23 MED ORDER — SCOPOLAMINE 1 MG/3DAYS TD PT72: 1.0000 | MEDICATED_PATCH | Freq: Once | TRANSDERMAL | Status: DC

## 2019-01-23 MED ORDER — MORPHINE SULFATE (PF) 0.5 MG/ML IJ SOLN
INTRAMUSCULAR | Status: AC
  Filled 2019-01-23: qty 10

## 2019-01-23 MED ORDER — PHENYLEPHRINE 40 MCG/ML (10ML) SYRINGE FOR IV PUSH (FOR BLOOD PRESSURE SUPPORT)
PREFILLED_SYRINGE | INTRAVENOUS | Status: AC
  Filled 2019-01-23: qty 10

## 2019-01-23 MED ORDER — NALOXONE HCL 0.4 MG/ML IJ SOLN: 0.4000 mg | INTRAMUSCULAR | Status: DC | PRN

## 2019-01-23 MED ORDER — SIMETHICONE 80 MG PO CHEW
80.0000 mg | CHEWABLE_TABLET | ORAL | Status: DC
  Administered 2019-01-23 – 2019-01-24 (×2): 80 mg via ORAL
  Filled 2019-01-23 (×2): qty 1

## 2019-01-23 MED ORDER — SIMETHICONE 80 MG PO CHEW: 80.0000 mg | CHEWABLE_TABLET | ORAL | Status: DC | PRN

## 2019-01-23 MED ORDER — SERTRALINE HCL 50 MG PO TABS
50.0000 mg | ORAL_TABLET | Freq: Every day | ORAL | Status: DC
  Administered 2019-01-23 – 2019-01-24 (×2): 50 mg via ORAL
  Filled 2019-01-23 (×2): qty 1

## 2019-01-23 MED ORDER — DIPHENHYDRAMINE HCL 50 MG/ML IJ SOLN: 12.5000 mg | INTRAMUSCULAR | Status: DC | PRN

## 2019-01-23 MED ORDER — SODIUM CHLORIDE 0.9 % IR SOLN
Status: DC | PRN
  Administered 2019-01-23: 1

## 2019-01-23 MED ORDER — PHENYLEPHRINE HCL-NACL 20-0.9 MG/250ML-% IV SOLN
INTRAVENOUS | Status: AC
  Filled 2019-01-23: qty 250

## 2019-01-23 MED ORDER — MENTHOL 3 MG MT LOZG: 1.0000 | LOZENGE | OROMUCOSAL | Status: DC | PRN

## 2019-01-23 MED ORDER — DIPHENHYDRAMINE HCL 25 MG PO CAPS: 25.0000 mg | ORAL_CAPSULE | Freq: Four times a day (QID) | ORAL | Status: DC | PRN

## 2019-01-23 MED ORDER — ONDANSETRON HCL 4 MG/2ML IJ SOLN
4.0000 mg | Freq: Three times a day (TID) | INTRAMUSCULAR | Status: DC | PRN
  Administered 2019-01-24: 07:00:00 4 mg via INTRAVENOUS
  Filled 2019-01-23: qty 2

## 2019-01-23 MED ORDER — CEFAZOLIN SODIUM-DEXTROSE 2-4 GM/100ML-% IV SOLN
INTRAVENOUS | Status: AC
  Filled 2019-01-23: qty 100

## 2019-01-23 MED ORDER — PHENYLEPHRINE 40 MCG/ML (10ML) SYRINGE FOR IV PUSH (FOR BLOOD PRESSURE SUPPORT)
PREFILLED_SYRINGE | INTRAVENOUS | Status: DC | PRN
  Administered 2019-01-23: 120 ug via INTRAVENOUS

## 2019-01-23 MED ORDER — OXYCODONE-ACETAMINOPHEN 5-325 MG PO TABS
1.0000 | ORAL_TABLET | ORAL | Status: DC | PRN
  Administered 2019-01-24: 06:00:00 1 via ORAL
  Administered 2019-01-24: 2 via ORAL
  Administered 2019-01-24 – 2019-01-25 (×4): 1 via ORAL
  Filled 2019-01-23 (×3): qty 1
  Filled 2019-01-23: qty 2
  Filled 2019-01-23 (×2): qty 1

## 2019-01-23 MED ORDER — OXYTOCIN 40 UNITS IN NORMAL SALINE INFUSION - SIMPLE MED
2.5000 [IU]/h | INTRAVENOUS | Status: DC
  Administered 2019-01-23: 14:00:00 2.5 [IU]/h via INTRAVENOUS

## 2019-01-23 MED ORDER — STERILE WATER FOR IRRIGATION IR SOLN
Status: DC | PRN
  Administered 2019-01-23: 1

## 2019-01-23 MED ORDER — ACETAMINOPHEN 500 MG PO TABS
1000.0000 mg | ORAL_TABLET | Freq: Four times a day (QID) | ORAL | Status: DC | PRN
  Administered 2019-01-23 – 2019-01-24 (×2): 1000 mg via ORAL
  Filled 2019-01-23 (×2): qty 2

## 2019-01-23 MED ORDER — ACETAMINOPHEN 10 MG/ML IV SOLN
INTRAVENOUS | Status: AC
  Filled 2019-01-23: qty 100

## 2019-01-23 MED ORDER — ACETAMINOPHEN 10 MG/ML IV SOLN
1000.0000 mg | Freq: Once | INTRAVENOUS | Status: DC | PRN
  Administered 2019-01-23: 1000 mg via INTRAVENOUS

## 2019-01-23 MED ORDER — OXYTOCIN 40 UNITS IN NORMAL SALINE INFUSION - SIMPLE MED
INTRAVENOUS | Status: AC
  Filled 2019-01-23: qty 1000

## 2019-01-23 MED ORDER — NALOXONE HCL 4 MG/10ML IJ SOLN
1.0000 ug/kg/h | INTRAVENOUS | Status: DC | PRN
  Filled 2019-01-23: qty 5

## 2019-01-23 MED ORDER — FENTANYL CITRATE (PF) 100 MCG/2ML IJ SOLN
INTRAMUSCULAR | Status: DC | PRN
  Administered 2019-01-23: 15 ug via INTRATHECAL

## 2019-01-23 SURGICAL SUPPLY — 37 items
BENZOIN TINCTURE PRP APPL 2/3 (GAUZE/BANDAGES/DRESSINGS) ×3 IMPLANT
CHLORAPREP W/TINT 26ML (MISCELLANEOUS) ×3 IMPLANT
CLAMP CORD UMBIL (MISCELLANEOUS) IMPLANT
CLOSURE STERI STRIP 1/2 X4 (GAUZE/BANDAGES/DRESSINGS) ×3 IMPLANT
CLOTH BEACON ORANGE TIMEOUT ST (SAFETY) ×3 IMPLANT
DRSG OPSITE POSTOP 4X10 (GAUZE/BANDAGES/DRESSINGS) ×3 IMPLANT
ELECT REM PT RETURN 9FT ADLT (ELECTROSURGICAL) ×3
ELECTRODE REM PT RTRN 9FT ADLT (ELECTROSURGICAL) ×1 IMPLANT
EXTRACTOR VACUUM M CUP 4 TUBE (SUCTIONS) IMPLANT
EXTRACTOR VACUUM M CUP 4' TUBE (SUCTIONS)
GLOVE BIO SURGEON STRL SZ7.5 (GLOVE) ×3 IMPLANT
GLOVE BIOGEL PI IND STRL 7.0 (GLOVE) ×1 IMPLANT
GLOVE BIOGEL PI INDICATOR 7.0 (GLOVE) ×2
GOWN STRL REUS W/TWL LRG LVL3 (GOWN DISPOSABLE) ×6 IMPLANT
KIT ABG SYR 3ML LUER SLIP (SYRINGE) IMPLANT
NEEDLE HYPO 22GX1.5 SAFETY (NEEDLE) ×3 IMPLANT
NEEDLE HYPO 25X5/8 SAFETYGLIDE (NEEDLE) IMPLANT
NEEDLE SPNL 20GX3.5 QUINCKE YW (NEEDLE) IMPLANT
NS IRRIG 1000ML POUR BTL (IV SOLUTION) ×3 IMPLANT
PACK C SECTION WH (CUSTOM PROCEDURE TRAY) ×3 IMPLANT
PENCIL SMOKE EVAC W/HOLSTER (ELECTROSURGICAL) ×3 IMPLANT
RETRACTOR TRAXI PANNICULUS (MISCELLANEOUS) ×1 IMPLANT
SUT MNCRL 0 VIOLET CTX 36 (SUTURE) ×3 IMPLANT
SUT MNCRL AB 3-0 PS2 27 (SUTURE) IMPLANT
SUT MON AB 2-0 CT1 27 (SUTURE) ×3 IMPLANT
SUT MON AB-0 CT1 36 (SUTURE) ×6 IMPLANT
SUT MONOCRYL 0 CTX 36 (SUTURE) ×6
SUT PLAIN 0 NONE (SUTURE) IMPLANT
SUT PLAIN 2 0 (SUTURE)
SUT PLAIN 2 0 XLH (SUTURE) IMPLANT
SUT PLAIN ABS 2-0 CT1 27XMFL (SUTURE) IMPLANT
SYR 20CC LL (SYRINGE) IMPLANT
SYR CONTROL 10ML LL (SYRINGE) ×3 IMPLANT
TOWEL OR 17X24 6PK STRL BLUE (TOWEL DISPOSABLE) ×3 IMPLANT
TRAXI PANNICULUS RETRACTOR (MISCELLANEOUS) ×2
TRAY FOLEY W/BAG SLVR 14FR LF (SET/KITS/TRAYS/PACK) ×3 IMPLANT
WATER STERILE IRR 1000ML POUR (IV SOLUTION) ×3 IMPLANT

## 2019-01-23 NOTE — Progress Notes (Signed)
[name] was referred for history of depression/anxiety.  * Referral screened out by Clinical Social Worker because none of the following criteria appear to apply:  ~ History of anxiety/depression during this pregnancy, or of post-partum depression following prior delivery. ~ Diagnosis of anxiety and/or depression within last 3 years OR * [name]'s symptoms currently being treated with medication and/or therapy.  Please contact the Clinical Social Worker if needs arise, by [name] request, or if [name] scores greater than 9/yes to question 10 on Edinburgh Postpartum Depression Screen.  Sarah Gibson, LCSWA  [facility] [phone]    

## 2019-01-23 NOTE — Transfer of Care (Signed)
Immediate Anesthesia Transfer of Care Note  Patient: Sarah Gibson  Procedure(s) Performed: Repeat CESAREAN SECTION (N/A Abdomen)  Patient Location: PACU  Anesthesia Type:Spinal  Level of Consciousness: awake, alert  and oriented  Airway & Oxygen Therapy: Patient Spontanous Breathing  Post-op Assessment: Report given to RN and Post -op Vital signs reviewed and stable  Post vital signs: Reviewed and stable  Last Vitals:  Vitals Value Taken Time  BP 102/71 01/23/2019 11:05 AM  Temp    Pulse 206 01/23/2019 11:06 AM  Resp 17 01/23/2019 11:06 AM  SpO2 89 % 01/23/2019 11:06 AM  Vitals shown include unvalidated device data.  Last Pain:  Vitals:   01/23/19 0738  TempSrc: Oral         Complications: No apparent anesthesia complications

## 2019-01-23 NOTE — Progress Notes (Signed)
Patient seen and examined. Consent witnessed and signed. No changes noted. Update completed. BP 128/82   Pulse 88   Temp 98.7 F (37.1 C) (Oral)   Resp 18   Ht 5\' 8"  (1.727 m)   Wt 101.6 kg   SpO2 100%   Breastfeeding Yes   BMI 34.06 kg/m   CBC    Component Value Date/Time   WBC 11.6 (H) 01/23/2019 0748   RBC 4.74 01/23/2019 0748   HGB 14.2 01/23/2019 0748   HCT 40.7 01/23/2019 0748   PLT 240 01/23/2019 0748   MCV 85.9 01/23/2019 0748   MCH 30.0 01/23/2019 0748   MCHC 34.9 01/23/2019 0748   RDW 12.6 01/23/2019 0748

## 2019-01-23 NOTE — Anesthesia Procedure Notes (Signed)
Spinal  Patient location during procedure: OR Start time: 01/23/2019 9:59 AM End time: 01/23/2019 10:09 AM Staffing Anesthesiologist: Elmer Picker, MD Performed: anesthesiologist  Preanesthetic Checklist Completed: patient identified, surgical consent, pre-op evaluation, timeout performed, IV checked, risks and benefits discussed and monitors and equipment checked Spinal Block Patient position: sitting Prep: site prepped and draped and DuraPrep Patient monitoring: cardiac monitor, continuous pulse ox and blood pressure Approach: midline Location: L3-4 Injection technique: single-shot Needle Needle type: Pencan  Needle gauge: 24 G Needle length: 9 cm Assessment Sensory level: T6 Additional Notes Functioning IV was confirmed and monitors were applied. Sterile prep and drape, including hand hygiene and sterile gloves were used. The patient was positioned and the spine was prepped. The skin was anesthetized with lidocaine.  Free flow of clear CSF was obtained prior to injecting local anesthetic into the CSF.  The spinal needle aspirated freely following injection.  The needle was carefully withdrawn.  The patient tolerated the procedure well.

## 2019-01-23 NOTE — Anesthesia Postprocedure Evaluation (Signed)
Anesthesia Post Note  Patient: Sarah Gibson  Procedure(s) Performed: Repeat CESAREAN SECTION (N/A Abdomen)     Patient location during evaluation: PACU Anesthesia Type: Spinal Level of consciousness: oriented and awake and alert Pain management: pain level controlled Vital Signs Assessment: post-procedure vital signs reviewed and stable Respiratory status: spontaneous breathing, respiratory function stable and patient connected to nasal cannula oxygen Cardiovascular status: blood pressure returned to baseline and stable Postop Assessment: no headache, no backache and no apparent nausea or vomiting Anesthetic complications: no    Last Vitals:  Vitals:   01/23/19 1343 01/23/19 1450  BP: 128/78 124/78  Pulse: (!) 52 (!) 59  Resp: 18 18  Temp: 36.7 C 36.4 C  SpO2: 100% 100%    Last Pain:  Vitals:   01/23/19 1450  TempSrc: Oral  PainSc:    Pain Goal:                   Markitta Ausburn L Jaci Desanto

## 2019-01-23 NOTE — H&P (Signed)
Sarah AristaStacie Gibson is a 28 y.o. female presenting for Repeat cesarean section. OB History    Gravida  2   Para  1   Term  1   Preterm      AB      Living  1     SAB      TAB      Ectopic      Multiple  0   Live Births  1          Past Medical History:  Diagnosis Date  . Anxiety   . Arrhythmia   . Bell's palsy    AT AGE 28 YEARS  . Headache   . MVP (mitral valve prolapse)   . Pregnancy induced hypertension    Past Surgical History:  Procedure Laterality Date  . CESAREAN SECTION N/A 11/07/2015   Procedure: CESAREAN SECTION;  Surgeon: Olivia Mackieichard Estellar Cadena, MD;  Location: [facility];  Service: Obstetrics;  Laterality: N/A;  . CHOLECYSTECTOMY    . LAPAROSCOPIC GASTRIC SLEEVE RESECTION    . TONSILLECTOMY AND ADENOIDECTOMY     AT AGE 89 YEARS  . WISDOM TOOTH EXTRACTION  2014   Family History: family history includes Depression in her mother; Diabetes in her maternal uncle and mother; Hypertension in her father and mother; Mental illness in her maternal uncle and mother; Migraines in her father. Social History:  reports that she has never smoked. She has never used smokeless tobacco. She reports that she does not drink alcohol or use drugs.     Maternal Diabetes: No Genetic Screening: Normal Maternal Ultrasounds/Referrals: Normal Fetal Ultrasounds or other Referrals:  None Maternal Substance Abuse:  No Significant Maternal Medications:  Meds include: Zoloft Significant Maternal Lab Results:  None Other Comments:  None  Review of Systems  Constitutional: Negative.   All other systems reviewed and are negative.  Maternal Medical History:  Contractions: Onset was less than 1 hour ago.   Frequency: rare.   Perceived severity is mild.    Fetal activity: Perceived fetal activity is normal.   Last perceived fetal movement was within the past hour.    Prenatal complications: PIH and preterm labor.   Prenatal Complications - Diabetes: none.      currently  breastfeeding. Maternal Exam:  Uterine Assessment: Contraction strength is mild.  Contraction frequency is rare.   Abdomen: Patient reports no abdominal tenderness. Surgical scars: low transverse.   Fetal presentation: vertex  Introitus: Normal vulva. Normal vagina.  Ferning test: not done.  Nitrazine test: not done. Amniotic fluid character: not assessed.  Pelvis: questionable for delivery.   Cervix: Cervix evaluated by digital exam.     Physical Exam  Nursing note and vitals reviewed. Constitutional: She is oriented to person, place, and time. She appears well-developed and well-nourished.  HENT:  Head: Normocephalic and atraumatic.  Neck: Normal range of motion. Neck supple.  Cardiovascular: Normal rate and regular rhythm.  Respiratory: Effort normal and breath sounds normal.  GI: Soft. Bowel sounds are normal.  Genitourinary:    Vulva, vagina and uterus normal.   Musculoskeletal: Normal range of motion.  Neurological: She is alert and oriented to person, place, and time. She has normal reflexes.  Skin: Skin is warm and dry.  Psychiatric: She has a normal mood and affect.    Prenatal labs: ABO, Rh: O/Positive/-- (09/13 0000) Antibody: Negative (09/13 0000) Rubella: Immune (09/13 0000) RPR: Nonreactive (09/13 0000)  HBsAg: Negative (09/13 0000)  HIV: Non-reactive (09/13 0000)  GBS:  Assessment/Plan: 39 week IUP Previous csection for rpt Complicated Migraine headaches-MRI pending History of Gastric Bypass Surgical consent done. Risks of surgery discussed and acknoledged.   [name] 01/23/2019, 7:31 AM

## 2019-01-23 NOTE — Anesthesia Preprocedure Evaluation (Addendum)
Anesthesia Evaluation  Patient identified by MRN, date of birth, ID band Patient awake    Reviewed: Allergy & Precautions, NPO status , Patient's Chart, lab work & pertinent test results  Airway Mallampati: II  TM Distance: >3 FB Neck ROM: Full    Dental no notable dental hx.    Pulmonary neg pulmonary ROS,    Pulmonary exam normal breath sounds clear to auscultation       Cardiovascular hypertension, Normal cardiovascular exam Rhythm:Regular Rate:Normal     Neuro/Psych  Headaches, PSYCHIATRIC DISORDERS Anxiety    GI/Hepatic Neg liver ROS, GERD  Medicated,  Endo/Other  negative endocrine ROS  Renal/GU negative Renal ROS  negative genitourinary   Musculoskeletal negative musculoskeletal ROS (+)   Abdominal   Peds  Hematology negative hematology ROS (+)   Anesthesia Other Findings Previous C/S, gHTN  Reproductive/Obstetrics (+) Pregnancy                            Anesthesia Physical Anesthesia Plan  ASA: III  Anesthesia Plan: Spinal   Post-op Pain Management:    Induction:   PONV Risk Score and Plan: Treatment may vary due to age or medical condition  Airway Management Planned: Natural Airway  Additional Equipment:   Intra-op Plan:   Post-operative Plan:   Informed Consent: I have reviewed the patients History and Physical, chart, labs and discussed the procedure including the risks, benefits and alternatives for the proposed anesthesia with the patient or authorized representative who has indicated his/her understanding and acceptance.     Dental advisory given  Plan Discussed with: CRNA  Anesthesia Plan Comments:         Anesthesia Quick Evaluation

## 2019-01-23 NOTE — Op Note (Signed)
Cesarean Section Procedure Note  Indications: previous uterine incision kerr x one  Pre-operative Diagnosis: 39 week 0 day pregnancy.  Post-operative Diagnosis: same  Surgeon: Lenoard Aden   Assistants: Renae Fickle, CNM  Anesthesia: Local anesthesia 0.25.% bupivacaine and Spinal anesthesia  ASA Class: 2  Procedure Details  The patient was seen in the Holding Room. The risks, benefits, complications, treatment options, and expected outcomes were discussed with the patient.  The patient concurred with the proposed plan, giving informed consent. The risks of anesthesia, infection, bleeding and possible injury to other organs discussed. Injury to bowel, bladder, or ureter with possible need for repair discussed. Possible need for transfusion with secondary risks of hepatitis or HIV acquisition discussed. Post operative complications to include but not limited to DVT, PE and Pneumonia noted. The site of surgery properly noted/marked. The patient was taken to Operating Room # B, identified as Sarah Gibson and the procedure verified as C-Section Delivery. A Time Out was held and the above information confirmed.  After induction of anesthesia, the patient was draped and prepped in the usual sterile manner. A Pfannenstiel incision was made and carried down through the subcutaneous tissue to the fascia. Fascial incision was made and extended transversely using Mayo scissors. The fascia was separated from the underlying rectus tissue superiorly and inferiorly. The peritoneum was identified and entered. Peritoneal incision was extended longitudinally. The utero-vesical peritoneal reflection was incised transversely and the bladder flap was bluntly freed from the lower uterine segment. A low transverse uterine incision(Kerr hysterotomy) was made. Delivered from OA presentation was a  female with Apgar scores of 9 at one minute and 9 at five minutes. Bulb suctioning gently performed. Neonatal team in  attendance.After the umbilical cord was clamped and cut cord blood was obtained for evaluation. The placenta was removed intact and appeared normal. The uterus was curetted with a dry lap pack. Good hemostasis was noted.The uterine outline, tubes and ovaries appeared normal. The uterine incision was closed with running locked sutures of 0 Monocryl x 2 layers. Hemostasis was observed. Lavage was carried out until clear.The parietal peritoneum was closed with a running 2-0 Monocryl suture. The fascia was then reapproximated with running sutures of 0 Monocryl. The skin was reapproximated with 3-0 monocryl after [place] closure with 2-0 plain.  Instrument, sponge, and needle counts were correct prior the abdominal closure and at the conclusion of the case.   Findings: FTLM, OA, anterior placenta, nl adnexa  Estimated Blood Loss:  500         Drains: foley                 Specimens: placenta to LD                 Complications:  None; patient tolerated the procedure well.         Disposition: PACU - hemodynamically stable.         Condition: stable  Attending Attestation: I performed the procedure.

## 2019-01-23 NOTE — Lactation Note (Signed)
This note was copied from a baby's chart. Lactation Consultation Note  Patient Name: Sarah Gibson [id number] Date: 01/23/2019 Reason for consult: Initial assessment;Term;Other (Comment)(bariatric surgery)  5 hours old FT female who is being exclusively BF by his mother, she's a P2 but not very experienced BF. Mom BF first baby only for 3 weeks and self-reported a low milk supply; she'd get less than an ounce in a single pumping session. Mom noted minimal breast changes during the pregnancy, she voiced her nipples got a little darker but her breast didn't get much bigger. She had bariatric surgery in 2018 after she had her first baby, so this is her first baby post-bariatric. Mom is only taking pre-natal vitamins but not calcium or vitamin D supplements; she's also on Zolof. Mom is familiar with hand expression and has been able to see droplets of colostrum when doing so. She has a Medela DEBP at home.  Offered assistance with latch but mom politely declined, she said she just had tried to latch baby to the breast but he was sleepy. Asked mom to call for assistance when needed.  Reviewed normal newborn behavior, cluster feeding and feeding cues.   Feeding plan:  1. Encouraged mom to feed baby STS 8-12 times/24 hours or sooner if feeding cues are present 2. Hand expression and spoon feeding were also encouraged  BF brochure, BF resources and feeding diary were reviewed. Parents reported all questions and concerns were answered, they're both aware of LC services and will call PRN.     Maternal Data Formula Feeding for Exclusion: No Has patient been taught Hand Expression?: Yes Does the patient have breastfeeding experience prior to this delivery?: Yes  Feeding Feeding Type: Breast Fed  LATCH Score Latch: Repeated attempts needed to sustain latch, nipple held in mouth throughout feeding, stimulation needed to elicit sucking reflex.  Audible Swallowing: None  Type of Nipple: Everted at  rest and after stimulation  Comfort (Breast/Nipple): Soft / non-tender  Hold (Positioning): Assistance needed to correctly position infant at breast and maintain latch.  LATCH Score: 6  Interventions Interventions: Breast feeding basics reviewed  Lactation Tools Discussed/Used WIC Program: No   Consult Status Consult Status: Follow-up Date: 01/24/19 Follow-up type: In-patient    [place] Warriner Venetia Constable 01/23/2019, 3:30 PM

## 2019-01-24 LAB — BIRTH TISSUE RECOVERY COLLECTION (PLACENTA DONATION)

## 2019-01-24 LAB — CBC
HCT: 34 % — ABNORMAL LOW (ref 36.0–46.0)
Hemoglobin: 11.6 g/dL — ABNORMAL LOW (ref 12.0–15.0)
MCH: 29.4 pg (ref 26.0–34.0)
MCHC: 34.1 g/dL (ref 30.0–36.0)
MCV: 86.3 fL (ref 80.0–100.0)
Platelets: 187 10*3/uL (ref 150–400)
RBC: 3.94 MIL/uL (ref 3.87–5.11)
RDW: 12.5 % (ref 11.5–15.5)
WBC: 10.1 10*3/uL (ref 4.0–10.5)
nRBC: 0 % (ref 0.0–0.2)

## 2019-01-24 NOTE — Lactation Note (Signed)
This note was copied from a baby'Sarah chart. Lactation Consultation Note  Patient Name: Sarah Theron AristaStacie Potenza RUEAV'WToday'Sarah Date: 01/24/2019 Reason for consult: Follow-up assessment;Term;Other (Comment);Infant weight loss(bariatric surgery)  25 hours old FT female who is now being partially BF and formula fed by his mother, she'Sarah a P2. Baby is at 5% weight loss. Mom reported feeling sore today, she was given a NS # 20 by her night time RN to keep her from getting more sore. Explained to mom that even though NS are barriers between mom and could protect for further soreness, the best treatment and prevention from getting sore it'Sarah a deep latch and her own colostrum.   MD was finishing up with mom when entering the room, she was trying to get baby to latch, baby just had 7 ml of Enfamil Gentlease formula. Baby very sleepy, LC had to place baby on the bassinet to wake him up. Once he was awake, LC took baby STS to mother'Sarah breast in football position and he was able to latch after a few tries, baby won't open his mouth wide enough but eventually he was able to get a deep latch. However while LC was talking to parents noticed that baby started loosing depth, asked mom to break the latch at the 9' mark and she had a small blister/skin tag. Mom said she'Sarah always had it before and that she wasn't sure if that happened on this feeding or it'Sarah been always like that, will have to monitor.  She stated that this particular feeding at the breast felt comfortable but no audible swallows were noted, only a couple during the beginning of the feeding but it was probably baby'Sarah own saliva. Set mom up with a DEBP, instructions, cleaning and storage were reviewed as well as milk storage guidelines. LC asked her RN Irving Burtonmily to get her some coconut oil. Instructed mom to apply prior feedings. Reviewed cluster feeding and feeding cues, pumping schedule as well as formula supplementation guidelines.  LC also showed parents the pace feeding  technique after he was done at the breast and kept cueing. Baby took 13 ml of Similac Gentlease, dad proceeded to burp baby when he was done.  Feeding plan  1. Encouraged mom to feed baby STS 8-12 times/24 hours or sooner if feeding cues are present 2. Hand expression and spoon feeding were also encouraged prior pumping 3. Mom will pump every 3 hours after feedings and feed baby any amount/drops of EBM she may get prior offering any formula 4. Parents will continue supplementing baby with Enfamil Gentlease after feedings using the pace feeding technique  Parents reported all questions and concerns were answered, they're both aware of LC services and will call PRN.   Maternal Data    Feeding Feeding Type: Breast Fed  LATCH Score Latch: Repeated attempts needed to sustain latch, nipple held in mouth throughout feeding, stimulation needed to elicit sucking reflex.  Audible Swallowing: None  Type of Nipple: Everted at rest and after stimulation  Comfort (Breast/Nipple): Filling, red/small blisters or bruises, mild/mod discomfort  Hold (Positioning): Assistance needed to correctly position infant at breast and maintain latch.  LATCH Score: 5  Interventions Interventions: Breast feeding basics reviewed;Assisted with latch;Skin to skin;Breast massage;Hand express;Breast compression;Adjust position;Support pillows;Position options;DEBP;Coconut oil  Lactation Tools Discussed/Used Tools: Pump Breast pump type: Double-Electric Breast Pump Pump Review: Setup, frequency, and cleaning;Milk Storage Initiated by:: MPeck Date initiated:: 01/24/19   Consult Status Consult Status: Follow-up Date: 01/25/19 Follow-up type: In-patient    Sarah Gibson  Sarah Gibson 01/24/2019, 12:05 PM

## 2019-01-24 NOTE — Progress Notes (Addendum)
POSTOPERATIVE DAY # 1 S/P Repeat LTCS, baby boy "Sarah Gibson"   S:         Reports feeling sore, but better than her last c/s              Tolerating po intake / (+) slight nausea earlier this morning, but has resolved / no vomiting / + flatus / no BM / reports some flatus pain up into her right chest/shoulder   Denies SOB, or CP; reports slight dizziness initially when standing             Bleeding is light             Pain controlled with Motrin and Percocet              Up ad lib / ambulatory/ has voided once since foley removal   Newborn breast feeding with formula supplementation - trying to latch baby first, then offer formula; reports she has not been producing much colostrum; was unable to breastfeed her first daughter because she never produced milk; has been working with lactation / Circumcision - planning prior to discharge    O:  VS: BP 118/80 (BP Location: Right Arm)   Pulse 62   Temp 98.4 F (36.9 C)   Resp 15   Ht 5\' 8"  (1.727 m)   Wt 101.6 kg   SpO2 100%   Breastfeeding Yes   BMI 34.06 kg/m  01/24/19 0745  98.4 F (36.9 C)  62  -  15  118/80  Semi-fowlers  100 %  -  - TL   01/24/19 0330  97.7 F (36.5 C)  60  -  16  119/74  Semi-fowlers  100 %  -  - MM   01/23/19 2353  98.4 F (36.9 C)  57Abnormal   -  18  124/78  Semi-fowlers  99 %  -  - MM   01/23/19 1936  98.2 F (36.8 C)  60  -  16  132/70  Semi-fowlers  100 %  -  - MM   01/23/19 1842  -  -  -  -  -  Orthostatic Vitals  97 %  -  - CW   01/23/19 1545  98 F (36.7 C)  57Abnormal   -  18  124/78  Semi-fowlers  100 %  -  - CW   01/23/19 1450  97.6 F (36.4 C)  59Abnormal   -  18  124/78  -  100 %  -  - MR   01/23/19 1450  -  -  -  -  -  Semi-fowlers  -  -  - CW   01/23/19 1343  98 F (36.7 C)  52Abnormal   -  18  128/78  Semi-fowlers             LABS:               Recent Labs    01/23/19 0748 01/24/19 0557  WBC 11.6* 10.1  HGB 14.2 11.6*  PLT 240 187               Bloodtype: --/--/O POS, O POS Performed  at [facility], [address] [place], [place] [address]  [phone](04/13 0740)  Rubella: Immune (09/13 0000)  I&O: Intake/Output      04/13 0701 - 04/14 0700 04/14 0701 - 04/15 0700   I.V. (mL/kg) 3340.2 (32.9)    Total Intake(mL/kg) 3340.2 (32.9)    Urine (mL/kg/hr) 2900    Blood 269    Total Output 3169    Net +171.2                      Physical Exam:             Alert and Oriented X3  Lungs: Clear and unlabored  Heart: regular rate and rhythm / no murmurs  Abdomen: soft, non-tender, non-distended, active bowel sounds in all quadrants              Fundus: firm, non-tender, U-2             Dressing: honeycomb with steri-strips; old drainage noted on right side of dressing <50%, looks moist under right side dressing              Incision:  approximated with sutures / no erythema / no ecchymosis / no drainage  Perineum: intact  Lochia: small, no clots   Extremities: no edema, no calf pain or tenderness,   A:        POD # 1S/P Repeat LTCS            Hx. of migraine headaches  Hx. Of gastric sleeve in 2017  Hx of Anxiety and Depression- on Zoloft 50mg  daily   Mild ABL Anemia - stable  GERD - on Protonix 80mg  at bedtime (takes Omeprazole at home)   P:        Routine postoperative care              Warm liquids and ambulation to promote bowel motility   Okay to d/c IV after second void   May shower today  Change honeycomb dressing after shower   Continue working with lactation  Continue inpatient care       Carlean Jews, MSN, CNM [facility]

## 2019-01-25 MED ORDER — OXYCODONE-ACETAMINOPHEN 5-325 MG PO TABS: 1.0000 | ORAL_TABLET | Freq: Four times a day (QID) | ORAL | 0 refills | Status: AC | PRN

## 2019-01-25 MED ORDER — IBUPROFEN 800 MG PO TABS: 800.0000 mg | ORAL_TABLET | Freq: Three times a day (TID) | ORAL | 0 refills | Status: AC

## 2019-01-25 MED ORDER — HYDROCORTISONE 1 % EX CREA: TOPICAL_CREAM | CUTANEOUS | 0 refills | Status: AC

## 2019-01-25 NOTE — Lactation Note (Signed)
This note was copied from a baby's chart. Lactation Consultation Note Noted by feeding chart mom has been formula feeding a lot. LC looked in room, lights out, mom resting, opened eyes. LC whispered for mom to call LC when she wakes up. Mom nodded yes.  Patient Name: Sarah Gibson PZWCH'E Date: 01/25/2019     Maternal Data    Feeding Feeding Type: Bottle Fed - Formula Nipple Type: Slow - flow  LATCH Score                   Interventions    Lactation Tools Discussed/Used     Consult Status      Sarah Gibson 01/25/2019, 3:38 AM

## 2019-01-25 NOTE — Progress Notes (Signed)
POSTOPERATIVE DAY # 2 S/P CS-repeat  S:         Reports feeling ok - sore but pain meds working / wants to go home today             Tolerating po intake / no nausea / no vomiting / + flatus / no BM             Bleeding is light             Up ad lib / ambulatory/ voiding QS  Newborn Bottle and formula  O:  VS: BP (!) 130/58 (BP Location: Right Arm)   Pulse 64   Temp 98.2 F (36.8 C) (Oral)   Resp 16   Ht 5\' 8"  (1.727 m)   Wt 101.6 kg   SpO2 99%   Breastfeeding Yes   BMI 34.06 kg/m   LABS:              Recent Labs    01/23/19 0748 01/24/19 0557  WBC 11.6* 10.1  HGB 14.2 11.6*  PLT 240 187               Bloodtype: --/--/O POS, O POS Performed at [facility], [address] [place], [place] [address]  [phone] 0740)  Rubella: Immune (09/13 0000)                 flu and tdap current 2020                                    Physical Exam:             Alert and Oriented X3  Lungs: Clear and unlabored  Heart: regular rate and rhythm / no mumurs  Abdomen: soft, non-tender, non-distended, erythema rash in shape of previous adherent drape             Fundus: firm, non-tender, Ueven             Dressing intact              Incision:  approximated with suture / no erythema / no ecchymosis / no drainage  Perineum: intact  Lochia: light  Extremities: no edema, no calf pain or tenderness. Negative Homan  A:        POD # 2 S/P repeat CS            anxiety& depression - stable on Zoloft 50 mg daily dose            contact dermatitis from OR drape &/or surgical prep  P:        Routine postoperative care              Topical hydrocortisone cream for contact dermatitis - remove honeycomb after arrival home             DC home   [name] CNM, MSN, [facility] 01/25/2019, 8:48 AM

## 2019-01-25 NOTE — Discharge Summary (Signed)
OB Discharge Summary  Patient Name: Sarah Gibson DOB: 02-25-1991 MRN: [id number]  Date of admission: 01/23/2019 Admitting diagnosis: Previous Cesarean Section Intrauterine pregnancy: [redacted]w[redacted]d      Date of discharge: 01/25/2019    Discharge diagnosis: Term Pregnancy Delivered and POD 2 s/p repeat CS     Prenatal history: G2P2002   EDC : 01/30/2019, Alternate EDD Entry  Prenatal care at [facility]  Primary provider : [place] Prenatal course complicated by previous CS, obesity s/p bariatric surgery, hx anxiety & depression, GERD  Prenatal Labs: ABO, Rh: --/--/O POS, O POS Performed at [facility], [address], [place], [place] [address]  [id number])  Antibody: NEG (04/13 0740) Rubella: Immune (09/13 0000)  RPR: Non Reactive (04/13 0748)  HBsAg: Negative (09/13 0000)  HIV: Non-reactive (09/13 0000)                        Hospital course:  Sceduled C/S   28 y.o. yo G2P2002 at [redacted]w[redacted]d was admitted to the hospital 01/23/2019 for scheduled cesarean section with the following indication:Elective Repeat. Membrane Rupture Time/Date: 10:26 AM ,01/23/2019  Patient delivered a Viable infant.01/23/2019 Details of operation can be found in separate operative note.    Patient had an uncomplicated postpartum course.  She is ambulating, tolerating a regular diet, passing flatus, and urinating well. Patient is discharged home in stable condition on  01/25/19  Delivering PROVIDER: Olivia Mackie                                                            Complications: None  Newborn Data: Live born female  Birth Weight: 8 lb 2 oz (3685 g) APGAR: 8, 9  Newborn Delivery   Birth date/time:  01/23/2019 10:26:00 Delivery type:  C-Section, Low Transverse Trial of labor:  No C-section categorization:  Repeat    Baby Feeding: Bottle and formula Disposition:home with mother  Post partum procedures: none  Labs: Lab Results  Component Value Date   WBC 10.1 01/24/2019    HGB 11.6 (L) 01/24/2019   HCT 34.0 (L) 01/24/2019   MCV 86.3 01/24/2019   PLT 187 01/24/2019   CMP Latest Ref Rng & Units 01/23/2019  Glucose 70 - 99 mg/dL 81  BUN 6 - 20 mg/dL 5(L)  Creatinine 3.20 - 1.00 mg/dL 2.33  Sodium 435 - 686 mmol/L 137  Potassium 3.5 - 5.1 mmol/L 3.7  Chloride 98 - 111 mmol/L 107  CO2 22 - 32 mmol/L 19(L)  Calcium 8.9 - 10.3 mg/dL 8.9  Total Protein 6.5 - 8.1 g/dL 6.0(L)  Total Bilirubin 0.3 - 1.2 mg/dL 0.7  Alkaline Phos 38 - 126 U/L 112  AST 15 - 41 U/L 23  ALT 0 - 44 U/L 16      Physical Exam @ time of discharge:  Vitals:   01/24/19 0745 01/24/19 1436 01/24/19 2125 01/25/19 0518  BP: 118/80 120/71 126/80 (!) 130/58  Pulse: 62 74 70 64  Resp: 15 16 16 16   Temp: 98.4 F (36.9 C) 98.9 F (37.2 C) 98.2 F (36.8 C) 98.2 F (36.8 C)  TempSrc:  Oral Oral Oral  SpO2: 100% 98% 98% 99%  Weight:      Height:  General: alert, cooperative and no distress Lochia: appropriate Uterine Fundus: firm Perineum: intact Incision: Healing well with no significant drainage Extremities: DVT Evaluation: No evidence of DVT seen on physical exam.   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   aspirin EC 81 MG  tablet Take 81 mg by mouth daily.   butalbital-acetaminophen-caffeine 50-325-40 MG tablet Commonly known as:  FIORICET, ESGIC Take 1 tablet by mouth 2 (two) times daily as needed for headache.   hydrocortisone cream 1 % Apply to affected area with rash 2 times daily   ibuprofen 800 MG tablet Commonly known as:  ADVIL,MOTRIN Take 1 tablet (800 mg total) by mouth every 8 (eight) hours.   multivitamin-prenatal 27-0.8 MG Tabs tablet Take 1 tablet by mouth at bedtime.   omeprazole 40 MG capsule Commonly known as:  PRILOSEC Take 40 mg by mouth at bedtime.   oxyCODONE-acetaminophen 5-325 MG tablet Commonly known as:  PERCOCET/ROXICET Take 1-2 tablets by mouth every 6 (six) hours as needed for up to 5 days for moderate pain.   sertraline 50 MG tablet Commonly known as:  ZOLOFT Take 50 mg by mouth at bedtime.            Discharge Care Instructions  (From admission, onward)         Start     Ordered   01/25/19 0000  Discharge wound care:    Comments:  Leave honeycomb in place for 5 days - remove if get wet in shower. Leave steri-strips in place x 2 weeks. Keep incision clean and dry   01/25/19 0909          Diet: routine diet  Activity: Advance as tolerated. Pelvic rest x 6 weeks.   Follow up:6 weeks    Signed: Marlinda Mikeanya Bailey CNM, MSN, [facility] 01/25/2019, 9:09 AM

## 2019-01-31 ENCOUNTER — Telehealth: Payer: Self-pay | Admitting: Neurology

## 2019-01-31 NOTE — Telephone Encounter (Signed)
Patients MRI's were scheduled on 02/07/19 at [facility]. But due to the Covid-19 we are not having MRI's done in the office right now. And I left a voicemail informing her of this and informing her that I will send the order to GI and if she has not heard from them in the next couple days to give them a call at [phone].

## 2019-02-07 ENCOUNTER — Other Ambulatory Visit: Payer: Self-pay

## 2019-02-07 ENCOUNTER — Ambulatory Visit
Admission: RE | Admit: 2019-02-07 | Discharge: 2019-02-07 | Disposition: A | Discharge status: Home / Self Care | Payer: BLUE CROSS/BLUE SHIELD | Source: Ambulatory Visit | Attending: Neurology | Admitting: Neurology

## 2019-02-07 ENCOUNTER — Other Ambulatory Visit: Payer: BLUE CROSS/BLUE SHIELD

## 2019-02-07 DIAGNOSIS — R29898 Other symptoms and signs involving the musculoskeletal system: Secondary | ICD-10-CM

## 2019-02-07 DIAGNOSIS — G441 Vascular headache, not elsewhere classified: Secondary | ICD-10-CM

## 2019-02-07 DIAGNOSIS — R4701 Aphasia: Secondary | ICD-10-CM

## 2019-02-07 DIAGNOSIS — R200 Anesthesia of skin: Secondary | ICD-10-CM

## 2019-02-07 DIAGNOSIS — R42 Dizziness and giddiness: Secondary | ICD-10-CM

## 2019-02-07 DIAGNOSIS — Z3A34 34 weeks gestation of pregnancy: Secondary | ICD-10-CM

## 2019-02-07 DIAGNOSIS — G08 Intracranial and intraspinal phlebitis and thrombophlebitis: Secondary | ICD-10-CM

## 2019-02-07 NOTE — Telephone Encounter (Signed)
Update: Dr. Lucia Gaskins will request that Dr. Epimenio Foot read the MRI.

## 2019-02-07 NOTE — Telephone Encounter (Signed)
Received a call from Odin at [facility]. Patient had her MRI today. Had her young baby with her, walking fine. She stated she saw an area on MRI, concern for possible stroke. I advised I would notify Dr. Lucia Gaskins.   Sent Dr. Lucia Gaskins message.

## 2019-03-15 ENCOUNTER — Telehealth: Payer: Self-pay

## 2019-03-15 NOTE — Telephone Encounter (Signed)
Patient stated that she had gotten a call from Dr. Lucia Gaskins after her test. Patient stated that she would like her appt to be cancelled. Appt has been cancelled.

## 2019-03-23 ENCOUNTER — Ambulatory Visit: Payer: BLUE CROSS/BLUE SHIELD | Admitting: Family Medicine

## 2020-02-12 ENCOUNTER — Telehealth: Payer: Self-pay | Admitting: *Deleted

## 2020-02-12 NOTE — Telephone Encounter (Signed)
I called pt per Dr. Lucia Gaskins to see if pt is amenable to repeating CT head. Pt agreeable and asked if she needs to pay OOP deductible. She is aware the order will be placed then she will be called by GI to schedule and can discuss her financial questions at that time. Pt verbalized appreciation.

## 2020-02-14 ENCOUNTER — Other Ambulatory Visit: Payer: Self-pay | Admitting: Neurology

## 2020-02-14 DIAGNOSIS — G9389 Other specified disorders of brain: Secondary | ICD-10-CM

## 2020-02-14 DIAGNOSIS — G8929 Other chronic pain: Secondary | ICD-10-CM

## 2020-02-14 NOTE — Progress Notes (Signed)
Ct head 

## 2020-02-19 ENCOUNTER — Telehealth: Payer: Self-pay | Admitting: Neurology

## 2020-02-19 NOTE — Telephone Encounter (Signed)
No auth through [facility]. Patient is scheduled at GI for 03/01/20.

## 2020-03-01 ENCOUNTER — Ambulatory Visit
Admission: RE | Admit: 2020-03-01 | Discharge: 2020-03-01 | Disposition: A | Discharge status: Home / Self Care | Payer: BC Managed Care – PPO | Source: Ambulatory Visit | Attending: Neurology | Admitting: Neurology

## 2020-03-01 ENCOUNTER — Other Ambulatory Visit: Payer: Self-pay

## 2020-03-01 DIAGNOSIS — G8929 Other chronic pain: Secondary | ICD-10-CM

## 2020-03-01 DIAGNOSIS — G9389 Other specified disorders of brain: Secondary | ICD-10-CM

## 2020-03-01 MED ORDER — IOPAMIDOL (ISOVUE-300) INJECTION 61%
75.0000 mL | Freq: Once | INTRAVENOUS | Status: AC | PRN
  Administered 2020-03-01: 75 mL via INTRAVENOUS

## 2020-04-16 ENCOUNTER — Telehealth: Payer: Self-pay | Admitting: Neurology

## 2020-04-16 ENCOUNTER — Ambulatory Visit: Payer: BC Managed Care – PPO | Admitting: Neurology

## 2020-04-16 ENCOUNTER — Encounter: Payer: Self-pay | Admitting: Family Medicine

## 2020-04-16 ENCOUNTER — Telehealth: Payer: Self-pay | Admitting: *Deleted

## 2020-04-16 ENCOUNTER — Other Ambulatory Visit: Payer: Self-pay

## 2020-04-16 ENCOUNTER — Ambulatory Visit: Payer: BC Managed Care – PPO | Admitting: Family Medicine

## 2020-04-16 VITALS — BP 132/77 | HR 77 | Ht 68.0 in | Wt 263.0 lb

## 2020-04-16 DIAGNOSIS — G43109 Migraine with aura, not intractable, without status migrainosus: Secondary | ICD-10-CM

## 2020-04-16 DIAGNOSIS — G9389 Other specified disorders of brain: Secondary | ICD-10-CM

## 2020-04-16 MED ORDER — AJOVY 225 MG/1.5ML ~~LOC~~ SOAJ: 225.0000 mg | SUBCUTANEOUS | 11 refills | Status: AC

## 2020-04-16 MED ORDER — NURTEC 75 MG PO TBDP: 75.0000 mg | ORAL_TABLET | Freq: Every day | ORAL | 11 refills | Status: AC

## 2020-04-16 NOTE — Patient Instructions (Addendum)
We will start Ajovy injection monthly. We will also try Nurtec for abortive therapy. You may want to try Aleve 1 tablet every 12 hours about 4-5 days prior to onset of menses.   Stay well hydrated. Eat regular meals. Try to exercise regularly.   Follow up with me in 3 months, sooner if needed    Migraine with aura: There is increased risk for stroke in women with migraine with aura and a contraindication for the combined contraceptive pill for use by women who have migraine with aura. The risk for women with migraine without aura is lower. However other risk factors like smoking are far more likely to increase stroke risk than migraine. There is a recommendation for no smoking and for the use of OCPs without estrogen such as progestogen only pills particularly for women with migraine with aura.Sarah Gibson. People who have migraine headaches with auras may be 3 times more likely to have a stroke caused by a blood clot, compared to migraine patients who don't see auras. Women who take hormone-replacement therapy may be 30 percent more likely to suffer a clot-based stroke than women not taking medication containing estrogen. Other risk factors like smoking and high blood pressure may be  much more important.   Magnesium: may help with headaches are aura, the best evidence for magnesium is for migraine with aura is its thought to stop the cortical spreading depression we believe is the pathophysiology of migraine aura.  To prevent or relieve headaches, try the following:  Cool Compress. Lie down and place a cool compress on your head.   Avoid headache triggers. If certain foods or odors seem to have triggered your migraines in the past, avoid them. A headache diary might help you identify triggers.   Include physical activity in your daily routine. Try a daily walk or other moderate aerobic exercise.   Manage stress. Find healthy ways to cope with the stressors, such as delegating tasks on your to-do list.    Practice relaxation techniques. Try deep breathing, yoga, massage and visualization.   Eat regularly. Eating regularly scheduled meals and maintaining a healthy diet might help prevent headaches. Also, drink plenty of fluids.   Follow a regular sleep schedule. Sleep deprivation might contribute to headaches  Consider biofeedback. With this mind-body technique, you learn to control certain bodily functions -- such as muscle tension, heart rate and blood pressure -- to prevent headaches or reduce headache pain.      Proceed to emergency room if you experience new or worsening symptoms or symptoms do not resolve, if you have new neurologic symptoms or if headache is severe, or for any concerning symptom.      Rimegepant oral dissolving tablet What is this medicine? RIMEGEPANT (ri ME je pant) is used to treat migraine headaches with or without aura. An aura is a strange feeling or visual disturbance that warns you of an attack. It is not used to prevent migraines. This medicine may be used for other purposes; ask your health care provider or pharmacist if you have questions. COMMON BRAND NAME(S): NURTEC ODT What should I tell my health care provider before I take this medicine? They need to know if you have any of these conditions:  kidney disease  liver disease  an unusual or allergic reaction to rimegepant, other medicines, foods, dyes, or preservatives  pregnant or trying to get pregnant  breast-feeding How should I use this medicine? Take the medicine by mouth. Follow the directions on the prescription label.  Leave the tablet in the sealed blister pack until you are ready to take it. With dry hands, open the blister and gently remove the tablet. If the tablet breaks or crumbles, throw it away and take a new tablet out of the blister pack. Place the tablet in the mouth and allow it to dissolve, and then swallow. Do not cut, crush, or chew this medicine. You do not need water to  take this medicine. Talk to your pediatrician about the use of this medicine in children. Special care may be needed. Overdosage: If you think you have taken too much of this medicine contact a poison control center or emergency room at once. NOTE: This medicine is only for you. Do not share this medicine with others. What if I miss a dose? This does not apply. This medicine is not for regular use. What may interact with this medicine? This medicine may interact with the following medications:  certain medicines for fungal infections like fluconazole, itraconazole  rifampin This list may not describe all possible interactions. Give your health care provider a list of all the medicines, herbs, non-prescription drugs, or dietary supplements you use. Also tell them if you smoke, drink alcohol, or use illegal drugs. Some items may interact with your medicine. What should I watch for while using this medicine? Visit your health care professional for regular checks on your progress. Tell your health care professional if your symptoms do not start to get better or if they get worse. What side effects may I notice from receiving this medicine? Side effects that you should report to your doctor or health care professional as soon as possible:  allergic reactions like skin rash, itching or hives; swelling of the face, lips, or tongue Side effects that usually do not require medical attention (report these to your doctor or health care professional if they continue or are bothersome):  nausea This list may not describe all possible side effects. Call your doctor for medical advice about side effects. You may report side effects to FDA at 1-800-FDA-1088. Where should I keep my medicine? Keep out of the reach of children. Store at room temperature between 15 and 30 degrees C (59 and 86 degrees F). Throw away any unused medicine after the expiration date. NOTE: This sheet is a summary. It may not cover  all possible information. If you have questions about this medicine, talk to your doctor, pharmacist, or health care provider.  2020 Elsevier/Gold Standard (2018-12-12 00:21:31)   Vernell Barrier injection What is this medicine? FREMANEZUMAB (fre ma NEZ ue mab) is used to prevent migraine headaches. This medicine may be used for other purposes; ask your health care provider or pharmacist if you have questions. COMMON BRAND NAME(S): AJOVY What should I tell my health care provider before I take this medicine? They need to know if you have any of these conditions:  an unusual or allergic reaction to fremanezumab, other medicines, foods, dyes, or preservatives  pregnant or trying to get pregnant  breast-feeding How should I use this medicine? This medicine is for injection under the skin. You will be taught how to prepare and give this medicine. Use exactly as directed. Take your medicine at regular intervals. Do not take your medicine more often than directed. It is important that you put your used needles and syringes in a special sharps container. Do not put them in a trash can. If you do not have a sharps container, call your pharmacist or healthcare provider to get  one. Talk to your pediatrician regarding the use of this medicine in children. Special care may be needed. Overdosage: If you think you have taken too much of this medicine contact a poison control center or emergency room at once. NOTE: This medicine is only for you. Do not share this medicine with others. What if I miss a dose? If you miss a dose, take it as soon as you can. If it is almost time for your next dose, take only that dose. Do not take double or extra doses. What may interact with this medicine? Interactions are not expected. This list may not describe all possible interactions. Give your health care provider a list of all the medicines, herbs, non-prescription drugs, or dietary supplements you use. Also tell them  if you smoke, drink alcohol, or use illegal drugs. Some items may interact with your medicine. What should I watch for while using this medicine? Tell your doctor or healthcare professional if your symptoms do not start to get better or if they get worse. What side effects may I notice from receiving this medicine? Side effects that you should report to your doctor or health care professional as soon as possible:  allergic reactions like skin rash, itching or hives, swelling of the face, lips, or tongue Side effects that usually do not require medical attention (report these to your doctor or health care professional if they continue or are bothersome):  pain, redness, or irritation at site where injected This list may not describe all possible side effects. Call your doctor for medical advice about side effects. You may report side effects to FDA at 1-800-FDA-1088. Where should I keep my medicine? Keep out of the reach of children. You will be instructed on how to store this medicine. Throw away any unused medicine after the expiration date on the label. NOTE: This sheet is a summary. It may not cover all possible information. If you have questions about this medicine, talk to your doctor, pharmacist, or health care provider.  2020 Elsevier/Gold Standard (2017-06-28 17:22:56)    Migraine Headache A migraine headache is a very strong throbbing pain on one side or both sides of your head. This type of headache can also cause other symptoms. It can last from 4 hours to 3 days. Talk with your doctor about what things may bring on (trigger) this condition. What are the causes? The exact cause of this condition is not known. This condition may be triggered or caused by:  Drinking alcohol.  Smoking.  Taking medicines, such as: ? Medicine used to treat chest pain (nitroglycerin). ? Birth control pills. ? Estrogen. ? Some blood pressure medicines.  Eating or drinking certain  products.  Doing physical activity. Other things that may trigger a migraine headache include:  Having a menstrual period.  Pregnancy.  Hunger.  Stress.  Not getting enough sleep or getting too much sleep.  Weather changes.  Tiredness (fatigue). What increases the risk?  Being 7-86 years old.  Being female.  Having a family history of migraine headaches.  Being Caucasian.  Having depression or anxiety.  Being very overweight. What are the signs or symptoms?  A throbbing pain. This pain may: ? Happen in any area of the head, such as on one side or both sides. ? Make it hard to do daily activities. ? Get worse with physical activity. ? Get worse around bright lights or loud noises.  Other symptoms may include: ? Feeling sick to your stomach (nauseous). ? Vomiting. ?  Dizziness. ? Being sensitive to bright lights, loud noises, or smells.  Before you get a migraine headache, you may get warning signs (an aura). An aura may include: ? Seeing flashing lights or having blind spots. ? Seeing bright spots, halos, or zigzag lines. ? Having tunnel vision or blurred vision. ? Having numbness or a tingling feeling. ? Having trouble talking. ? Having weak muscles.  Some people have symptoms after a migraine headache (postdromal phase), such as: ? Tiredness. ? Trouble thinking (concentrating). How is this treated?  Taking medicines that: ? Relieve pain. ? Relieve the feeling of being sick to your stomach. ? Prevent migraine headaches.  Treatment may also include: ? Having acupuncture. ? Avoiding foods that bring on migraine headaches. ? Learning ways to control your body functions (biofeedback). ? Therapy to help you know and deal with negative thoughts (cognitive behavioral therapy). Follow these instructions at home: Medicines  Take over-the-counter and prescription medicines only as told by your doctor.  Ask your doctor if the medicine prescribed to  you: ? Requires you to avoid driving or using heavy machinery. ? Can cause trouble pooping (constipation). You may need to take these steps to prevent or treat trouble pooping:  Drink enough fluid to keep your pee (urine) pale yellow.  Take over-the-counter or prescription medicines.  Eat foods that are high in fiber. These include beans, whole grains, and fresh fruits and vegetables.  Limit foods that are high in fat and sugar. These include fried or sweet foods. Lifestyle  Do not drink alcohol.  Do not use any products that contain nicotine or tobacco, such as cigarettes, e-cigarettes, and chewing tobacco. If you need help quitting, ask your doctor.  Get at least 8 hours of sleep every night.  Limit and deal with stress. General instructions      Keep a journal to find out what may bring on your migraine headaches. For example, write down: ? What you eat and drink. ? How much sleep you get. ? Any change in what you eat or drink. ? Any change in your medicines.  If you have a migraine headache: ? Avoid things that make your symptoms worse, such as bright lights. ? It may help to lie down in a dark, quiet room. ? Do not drive or use heavy machinery. ? Ask your doctor what activities are safe for you.  Keep all follow-up visits as told by your doctor. This is important. Contact a doctor if:  You get a migraine headache that is different or worse than others you have had.  You have more than 15 headache days in one month. Get help right away if:  Your migraine headache gets very bad.  Your migraine headache lasts longer than 72 hours.  You have a fever.  You have a stiff neck.  You have trouble seeing.  Your muscles feel weak or like you cannot control them.  You start to lose your balance a lot.  You start to have trouble walking.  You pass out (faint).  You have a seizure. Summary  A migraine headache is a very strong throbbing pain on one side or  both sides of your head. These headaches can also cause other symptoms.  This condition may be treated with medicines and changes to your lifestyle.  Keep a journal to find out what may bring on your migraine headaches.  Contact a doctor if you get a migraine headache that is different or worse than others you have had.  Contact your doctor if you have more than 15 headache days in a month. This information is not intended to replace advice given to you by your health care provider. Make sure you discuss any questions you have with your health care provider. Document Revised: 01/20/2019 Document Reviewed: 11/10/2018 Elsevier Patient Education  2020 [employer].

## 2020-04-16 NOTE — Telephone Encounter (Signed)
This was placed in a new patient slot but I have seen her befofre she can go to NP for an appointment

## 2020-04-16 NOTE — Telephone Encounter (Signed)
Pt no showed appt this AM.

## 2020-04-16 NOTE — Progress Notes (Addendum)
PATIENT: Sarah Gibson DOB: 1991/07/02  REASON FOR VISIT: follow up HISTORY FROM: patient  Chief Complaint  Patient presents with  . Follow-up    rm 1 here for a f/u on head pain. Pt is having migraines1-2 days before her cycle. Pt complaines of pain at the top of her head that feels like drilling going down in to her jaw.     HISTORY OF PRESENT ILLNESS: Today 04/16/20 Sarah Gibson is a 29 y.o. female here today for follow up for migraines. She was last seen in 12/2018. Abortive treatment was started and Reglan used due to pregnancy. MRI on 02/07/2019 showed calcified area left of midline near vortex present on CT from 2019.  MRV showed mild narrowing of mid transverse sinuses that did not appear to be hemodynamically significant. Repeat CT in 02/2020 was normal. She reports that migraines have continued. They seem pretty consistent around the time of her menstrual cycle. Usually 2-3 migraines per month. Typically she will have unilateral throbbing in the top of her head. She is sensitive to light. She endorses confusion, nausea and blurry vision with headache. She also notes peripheral vision loss about 30 minutes prior to headache. Headaches usually last 24-48 hours. She has been on lamotrigine, topiramate, bupropion and atenolol in the past. She has used "2-3 other mood stabilizers" as well. She had an allergic reaction to sumatriptan (confsuion and facial edema) but was unsure if this was directly linked to triptan as these symptoms are consistent with migraines. She is on birth control. She admits that she does not drink much water. She does eat regular meals. Eye exams annually have always been normal.   HISTORY: (copied from Dr Trevor Mace note on 12/21/2018)  This is a 29 year old who was seen in the past for lumbar radiculopathy.  She is here today as a new request from Dr. Billy Coast for migraines. Reviewed Dr. 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  She has a family history of migraines in her father.  She is currently on Fioricet, Ambien, Zoloft.  Her last appointment was December 15, 2018 and at that time she was pregnant.  She was referred here for migraines in pregnancy. Since then she has fogginess, difficulty speaking, words are coming out wrong. She feels dizzy as well.  Patient is here with her mother who also provides information.  She is [redacted] weeks pregnant.  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  She is here with her mother who also provides information. She started migraines when she was in 6th grade. She did well in regards to migraines with her first child. This pregnancy she is experiencing new symptoms with speech difficulties. Her migraines n the past typically start with an aura in the right eye and then follows with pain, light and sound sensitivity, nausea, pulsating/pounding. Sleep helps. This past Thursday she had a migraine and her BP was 140, she started to lose speech and cognitive abilities, couldn't form sentences, get words out correctly. She went to the ED, no imaging was completed, she felt better, monitored the baby. She also experienced numbness in her mouth and tongue and hands and arms and into her legs. Bilaterally. She had a bells palsy on the left at 13.   CMP showed BUN <5 and creatinine 0.46.  Reviewed notes from the ED: Patient was seen in the emergency room March 5 due to migraine.  She also experienced slurred speech and numbness in her hands and legs.  Her blood pressure was elevated.  Brought to the emergency room.  Possible hypertensive crisis with a headache.  No neuro deficits were noted however.   HPI 3 years ago:  Sarah Gibson is a 29 y.o. female here as a referral from Dr. Billy Coast for pain in primarily the left leg after epidural on Janu 25th. Past medical history of depression and anxiety, morbid obesity, transient hypertension in  third trimester. She had an emergency c-section. She didn't get past 4cm and didn't push. Baby's head was stuck in the birth canal. She was in a lot of pain after her c-section. After week 4-5 she noticed that the pain was in her back. She has pressure on the left side of the back that radiates into the hip and thigh to above the knee. Starts in the back and radiates to the the (points to the anterolateral) left thigh. Pulsing, sharp pain. Sometimes is dull. Sharp in the back and numbness. Discussed meralgia paresthetica. The pain in the back and the thigh pain don't always happen together. The pain hurts the most when she has been sitting up or standing for long periods. She was in the car 40 minutes and about 30 minutes later felt pain starting in the back. The leg pain not always with the the back pain. But sometimes she feels them together. No weakness. No changes in bowel or bladder. No other focal neurologic symptoms or complaints.  Reviewed notes, labs and imaging from outside physicians, which showed: Reviewed notes from [facility]. Patient's delivery date was 11/07/2015. She had a C-section. Birth weight was 9 lbs. 1 oz. Patient had antepartum transient hypertension of the third trimester which is resolved.  Nov 06 2015: CBC elevated white blood cells and anemia 9.8/28.1, CMP was unremarkable, RPR negative,    REVIEW OF SYSTEMS: Out of a complete 14 system review of symptoms, the patient complains only of the following symptoms, migraines and all other reviewed systems are negative.   ALLERGIES: Allergies  Allergen Reactions  . Sumatriptan Succinate Swelling    Other reaction(s): Confusion (intolerance), Facial Edema (intolerance)  . Cat Hair Extract Itching    Other reaction(s): Other (See Comments) Allergy to cats  "sinus infection" Allergy to cats  "sinus infection"   . Other     Not supposed to take NSAIDs d/t gastric sleeve surgery    HOME MEDICATIONS: Outpatient  Medications Prior to Visit  Medication Sig Dispense Refill  . acetaminophen (TYLENOL) 500 MG tablet Take 1,000 mg by mouth every 6 (six) hours as needed for mild pain or headache.     . ibuprofen (ADVIL,MOTRIN) 800 MG tablet Take 1 tablet (800 mg total) by mouth every 8 (eight) hours. 30 tablet 0  . Norgestimate-Ethinyl Estradiol Triphasic (TRI-LO-SPRINTEC) 0.18/0.215/0.25 MG-25 MCG tab Take 1 tablet by mouth daily.    Marland Kitchen omeprazole (PRILOSEC) 40 MG capsule Take 40 mg by mouth at bedtime.     Marland Kitchen aspirin EC 81 MG tablet Take 81 mg by mouth daily.    . butalbital-acetaminophen-caffeine (FIORICET, ESGIC) 50-325-40 MG tablet Take 1 tablet by mouth 2 (two) times daily as needed for headache.    . Prenatal Vit-Fe Fumarate-FA (MULTIVITAMIN-PRENATAL) 27-0.8 MG TABS tablet Take 1 tablet by mouth at bedtime.    . sertraline (ZOLOFT) 50 MG tablet Take 50 mg by mouth at bedtime.     No facility-administered medications prior to visit.    PAST MEDICAL  HISTORY: Past Medical History:  Diagnosis Date  . Anxiety   . Arrhythmia   . Bell's palsy    AT AGE 42 YEARS  . Headache   . MVP (mitral valve prolapse)   . Pregnancy induced hypertension     PAST SURGICAL HISTORY: Past Surgical History:  Procedure Laterality Date  . CESAREAN SECTION N/A 11/07/2015   Procedure: CESAREAN SECTION;  Surgeon: Olivia Mackieichard Taavon, MD;  Location: [facility];  Service: Obstetrics;  Laterality: N/A;  . CESAREAN SECTION N/A 01/23/2019   Procedure: Repeat CESAREAN SECTION;  Surgeon: Olivia Mackieaavon, Richard, MD;  Location: [facility];  Service: Obstetrics;  Laterality: N/A;  EDD: 01/30/19  . CHOLECYSTECTOMY    . LAPAROSCOPIC GASTRIC SLEEVE RESECTION    . TONSILLECTOMY AND ADENOIDECTOMY     AT AGE 35 YEARS  . WISDOM TOOTH EXTRACTION  2014    FAMILY HISTORY: Family History  Problem Relation Age of Onset  . Hypertension Mother   . Diabetes Mother   . Depression Mother   . Mental illness Mother   . Hypertension Father   . Migraines Father    . Diabetes Maternal Uncle   . Mental illness Maternal Uncle   . Neuropathy Neg Hx     SOCIAL HISTORY: Social History   Socioeconomic History  . Marital status: Married    Spouse name: Kurtis   . Number of children: 1  . Years of education: 3716  . Highest education level: Associate degree: academic program  Occupational History  . Occupation: [employer]  Tobacco Use  . Smoking status: Never Smoker  . Smokeless tobacco: Never Used  Vaping Use  . Vaping Use: Never used  Substance and Sexual Activity  . Alcohol use: No    Alcohol/week: 0.0 standard drinks  . Drug use: No  . Sexual activity: Not on file  Other Topics Concern  . Not on file  Social History Narrative   Lives with spouse and child Zoey   Caffeine use:  3-4 cups daily (soda/tea)   Social Determinants of Health   Financial Resource Strain:   . Difficulty of Paying Living Expenses:   Food Insecurity:   . Worried About Programme researcher, broadcasting/film/videounning Out of Food in the Last Year:   . Baristaan Out of Food in the Last Year:   Transportation Needs:   . Freight forwarderLack of Transportation (Medical):   Marland Kitchen. Lack of Transportation (Non-Medical):   Physical Activity:   . Days of Exercise per Week:   . Minutes of Exercise per Session:   Stress:   . Feeling of Stress :   Social Connections:   . Frequency of Communication with Friends and Family:   . Frequency of Social Gatherings with Friends and Family:   . Attends Religious Services:   . Active Member of Clubs or Organizations:   . Attends BankerClub or Organization Meetings:   Marland Kitchen. Marital Status:   Intimate Partner Violence:   . Fear of Current or Ex-Partner:   . Emotionally Abused:   Marland Kitchen. Physically Abused:   . Sexually Abused:       PHYSICAL EXAM  Vitals:   04/16/20 0818  BP: 132/77  Pulse: 77  Weight: 263 lb (119.3 kg)  Height: 5\' 8"  (1.727 m)   Body mass index is 39.99 kg/m.  Generalized: Well developed, in no acute distress  Cardiology: normal rate and rhythm, no murmur  noted Respiratory: clear to auscultation bilaterally  Neurological examination  Mentation: Alert oriented to time, place, history taking. Follows all commands  speech and language fluent Cranial nerve II-XII: Pupils were equal round reactive to light. Extraocular movements were full, visual field were full  Motor: The motor testing reveals 5 over 5 strength of all 4 extremities. Good symmetric motor tone is noted throughout.  Gait and station: Gait is normal.  DIAGNOSTIC DATA (LABS, IMAGING, TESTING) - I reviewed patient records, labs, notes, testing and imaging myself where available.  No flowsheet data found.   Lab Results  Component Value Date   WBC 10.1 01/24/2019   HGB 11.6 (L) 01/24/2019   HCT 34.0 (L) 01/24/2019   MCV 86.3 01/24/2019   PLT 187 01/24/2019      Component Value Date/Time   NA 137 01/23/2019 0748   K 3.7 01/23/2019 0748   CL 107 01/23/2019 0748   CO2 19 (L) 01/23/2019 0748   GLUCOSE 81 01/23/2019 0748   BUN 5 (L) 01/23/2019 0748   CREATININE 0.48 01/23/2019 0748   CALCIUM 8.9 01/23/2019 0748   PROT 6.0 (L) 01/23/2019 0748   ALBUMIN 2.7 (L) 01/23/2019 0748   AST 23 01/23/2019 0748   ALT 16 01/23/2019 0748   ALKPHOS 112 01/23/2019 0748   BILITOT 0.7 01/23/2019 0748   GFRNONAA >60 01/23/2019 0748   GFRAA >60 01/23/2019 0748   No results found for: CHOL, HDL, LDLCALC, LDLDIRECT, TRIG, CHOLHDL No results found for: HMCN4B No results found for: VITAMINB12 No results found for: TSH     ASSESSMENT AND PLAN 29 y.o. year old female  has a past medical history of Anxiety, Arrhythmia, Bell's palsy, Headache, MVP (mitral valve prolapse), and Pregnancy induced hypertension. here with     ICD-10-CM   1. Migraine with aura and without status migrainosus, not intractable  G43.109   2. Calcification of brain  G93.89     Neomia notes continued migraines over the past year. Migraine symptoms are usually prior to start of menses but can last days at a time. We  will start Ajovy injections monthly. She was educated on appropriate administration and storage of this medication. We will also try Nurtec for abortive therapy. There is a questionable allergy to sumatriptan reportedly causing confusion and facial swelling. She was encouraged to drink 50-60 ounces of water daily, eat regular meals and try to exercise regularly. We have discussed risks of stroke in women who have migraine with aura on birth control. She will continue this conversation with her OB/GYN provider. Stroke precautions reviewed. CT 02/2020 reassuring and calcification not noted. She will follow up with [place] in 3 months and annually thereafter if doing well. She verbalizes understanding and agreement with this plan.    No orders of the defined types were placed in this encounter.    Meds ordered this encounter  Medications  . Fremanezumab-vfrm (AJOVY) 225 MG/1.5ML SOAJ    Sig: Inject 225 mg into the skin every 30 (thirty) days.    Dispense:  1 pen    Refill:  11    Order Specific Question:   Supervising Provider    Answer:   Anson Fret [id number]  . Rimegepant Sulfate (NURTEC) 75 MG TBDP    Sig: Take 75 mg by mouth daily.    Dispense:  8 tablet    Refill:  11    Order Specific Question:   Supervising Provider    Answer:   Anson Fret [id number]      I spent 25 minutes with the patient. 50% of this time was spent counseling and educating patient on plan  of care and medications.    Shawnie Dapper, FNP-C 04/16/2020, 9:58 AM [facility] [address] [place], [place] [address] [phone]  Made any corrections needed, and agree with history, physical, neuro exam,assessment and plan as stated.     Naomie Dean, MD [facility]

## 2020-04-23 ENCOUNTER — Telehealth: Payer: Self-pay | Admitting: Family Medicine

## 2020-04-23 NOTE — Telephone Encounter (Signed)
Received a PA request for Ajovy. PA was completed on [email or web address]. Key is [id number]. PA was approved from 03/24/2020 to 04/23/2021. Pharmacy is aware of approval.

## 2020-04-29 NOTE — Progress Notes (Signed)
PA submitted for Nurtec 75mg  on Cover my Meds. There is a 24/72hr turn around. RX: Key: [id number] Status: PENDING 781

## 2020-05-02 ENCOUNTER — Telehealth: Payer: Self-pay | Admitting: Family Medicine

## 2020-05-02 NOTE — Telephone Encounter (Signed)
Yes please. 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

## 2020-05-02 NOTE — Telephone Encounter (Signed)
Received a determination fax for Nurtec. PA was denied since the Nurtec will be used in combo with Ajovy. [facility] was made aware that the Nurtec would be used only for breakthrough episodes of migraines but the PA was still denied.   Amy, would you like for me to appeal this decision?

## 2020-05-22 ENCOUNTER — Telehealth: Payer: Self-pay | Admitting: *Deleted

## 2020-05-22 NOTE — Telephone Encounter (Signed)
Received fax from [facility]: Nurtec approved 04/24/20 through 05/17/2021. Faxed approval letter to pharmacy. Sent her my chart to advise.

## 2020-07-29 ENCOUNTER — Ambulatory Visit: Payer: BC Managed Care – PPO | Admitting: Family Medicine

## 2020-07-29 NOTE — Progress Notes (Deleted)
No chief complaint on file.    HISTORY OF PRESENT ILLNESS: Today 07/29/20  Sarah Gibson is a 29 y.o. female here today for follow up for migraines. We started Ajovy and Nurtec at last visit in 04/2020.    HISTORY (copied from note on 04/16/2020)  Sarah Gibson is a 29 y.o. female here today for follow up for migraines. She was last seen in 12/2018. Abortive treatment was started and Reglan used due to pregnancy. MRI on 02/07/2019 showed calcified area left of midline near vortex present on CT from 2019.  MRV showed mild narrowing of mid transverse sinuses that did not appear to be hemodynamically significant. Repeat CT in 02/2020 was normal. She reports that migraines have continued. They seem pretty consistent around the time of her menstrual cycle. Usually 2-3 migraines per month. Typically she will have unilateral throbbing in the top of her head. She is sensitive to light. She endorses confusion, nausea and blurry vision with headache. She also notes peripheral vision loss about 30 minutes prior to headache. Headaches usually last 24-48 hours. She has been on lamotrigine, topiramate, bupropion and atenolol in the past. She has used "2-3 other mood stabilizers" as well. She had an allergic reaction to sumatriptan (confsuion and facial edema) but was unsure if this was directly linked to triptan as these symptoms are consistent with migraines. She is on birth control. She admits that she does not drink much water. She does eat regular meals. Eye exams annually have always been normal.   HISTORY: (copied from Dr Trevor Mace note on 12/21/2018)  This is a 29 year old who was seen in the past for lumbar radiculopathy. She is here today as a new request from Dr. Burnell Blanks migraines.Reviewed Dr. 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 She has a family history of migraines in her father. She is currently on  Fioricet, Ambien, Zoloft. Her last appointment was December 15, 2018 and at that time she was pregnant. She was referred here for migraines in pregnancy.Since then she has fogginess, difficulty speaking, words are coming out wrong. She feels dizzy as well.Patient is here with her mother who also provides information. She is [redacted] weeks pregnant. 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  She is here with her mother who also provides information. She started migraines when she was in 6th grade. She did well in regards to migraines with her first child. This pregnancy she is experiencing new symptoms with speech difficulties. Her migraines n the past typically start with an aura in the right eye and then follows with pain, light and sound sensitivity, nausea, pulsating/pounding. Sleep helps. This past Thursday she had a migraine and her BP was 140, she started to lose speech and cognitive abilities, couldn't form sentences, get words out correctly. She went to the ED, no imaging was completed, she felt better, monitored the baby. She also experienced numbness in her mouth and tongue and hands and arms and into her legs. Bilaterally. She had a bells palsy on the left at 13.   CMP showed BUN <5 and creatinine 0.46.  Reviewed notes from the [facility] was seen in the emergency room March 5 due to migraine. She also experienced slurred speech and numbness in her hands and legs. Her blood pressure was elevated. Brought to the emergency room. Possible hypertensive crisis with a headache. No neuro deficits were noted however.   HPI3 years HDQ:QIWLNL  Stanleyis a 29 y.o.femalehere as a referral from Dr. Burnell Blanks pain in primarily the left leg after epidural on Janu 25th. Past medical history of depression and anxiety, morbid obesity, transient hypertension in third trimester. She had an emergency c-section. She didn't get past 4cm and didn't push. Baby's head was stuck in the birth canal. She  was in a lot of pain after her c-section. After week 4-5 she noticed that the pain was in her back. She has pressure on the left side of the back that radiates into the hip and thigh to above the knee. Starts in the back and radiates to the the (points to the anterolateral) left thigh. Pulsing, sharp pain. Sometimes is dull. Sharp in the back and numbness. Discussed meralgia paresthetica. The pain in the back and the thigh pain don't always happen together. The pain hurts the most when she has been sitting up or standing for long periods. She was in the car 40 minutes and about 30 minutes later felt pain starting in the back. The leg pain not always with the the back pain. But sometimes she feels them together. No weakness. No changes in bowel or bladder. No other focal neurologic symptoms or complaints.  Reviewed notes, labs and imaging from outside physicians, which showed: Reviewed notes from [facility]. Patient's delivery date was 11/07/2015. She had a C-section. Birth weight was 9 lbs. 1 oz. Patient had antepartum transient hypertension of the third trimester which is resolved.  Nov 06 2015: CBC elevated white blood cells and anemia 9.8/28.1, CMP was unremarkable, RPR negative,    REVIEW OF SYSTEMS: Out of a complete 14 system review of symptoms, the patient complains only of the following symptoms, and all other reviewed systems are negative.   ALLERGIES: Allergies  Allergen Reactions  . Sumatriptan Succinate Swelling    Other reaction(s): Confusion (intolerance), Facial Edema (intolerance)  . Cat Hair Extract Itching    Other reaction(s): Other (See Comments) Allergy to cats  "sinus infection" Allergy to cats  "sinus infection"   . Other     Not supposed to take NSAIDs d/t gastric sleeve surgery     HOME MEDICATIONS: Outpatient Medications Prior to Visit  Medication Sig Dispense Refill  . acetaminophen (TYLENOL) 500 MG tablet Take 1,000 mg by mouth every 6 (six) hours as  needed for mild pain or headache.     . Fremanezumab-vfrm (AJOVY) 225 MG/1.5ML SOAJ Inject 225 mg into the skin every 30 (thirty) days. 1 pen 11  . ibuprofen (ADVIL,MOTRIN) 800 MG tablet Take 1 tablet (800 mg total) by mouth every 8 (eight) hours. 30 tablet 0  . Norgestimate-Ethinyl Estradiol Triphasic (TRI-LO-SPRINTEC) 0.18/0.215/0.25 MG-25 MCG tab Take 1 tablet by mouth daily.    Marland Kitchen omeprazole (PRILOSEC) 40 MG capsule Take 40 mg by mouth at bedtime.     . Rimegepant Sulfate (NURTEC) 75 MG TBDP Take 75 mg by mouth daily. 8 tablet 11   No facility-administered medications prior to visit.     PAST MEDICAL HISTORY: Past Medical History:  Diagnosis Date  . Anxiety   . Arrhythmia   . Bell's palsy    AT AGE 63 YEARS  . Headache   . MVP (mitral valve prolapse)   . Pregnancy induced hypertension      PAST SURGICAL HISTORY: Past Surgical History:  Procedure Laterality Date  . CESAREAN SECTION N/A 11/07/2015   Procedure: CESAREAN SECTION;  Surgeon: Olivia Mackie, MD;  Location: [facility];  Service: Obstetrics;  Laterality: N/A;  .  CESAREAN SECTION N/A 01/23/2019   Procedure: Repeat CESAREAN SECTION;  Surgeon: Olivia Mackie, MD;  Location: [facility];  Service: Obstetrics;  Laterality: N/A;  EDD: 01/30/19  . CHOLECYSTECTOMY    . LAPAROSCOPIC GASTRIC SLEEVE RESECTION    . TONSILLECTOMY AND ADENOIDECTOMY     AT AGE 81 YEARS  . WISDOM TOOTH EXTRACTION  2014     FAMILY HISTORY: Family History  Problem Relation Age of Onset  . Hypertension Mother   . Diabetes Mother   . Depression Mother   . Mental illness Mother   . Hypertension Father   . Migraines Father   . Diabetes Maternal Uncle   . Mental illness Maternal Uncle   . Neuropathy Neg Hx      SOCIAL HISTORY: Social History   Socioeconomic History  . Marital status: Married    Spouse name: Kurtis   . Number of children: 1  . Years of education: 31  . Highest education level: Associate degree: academic program  Occupational  History  . Occupation: [employer]  Tobacco Use  . Smoking status: Never Smoker  . Smokeless tobacco: Never Used  Vaping Use  . Vaping Use: Never used  Substance and Sexual Activity  . Alcohol use: No    Alcohol/week: 0.0 standard drinks  . Drug use: No  . Sexual activity: Not on file  Other Topics Concern  . Not on file  Social History Narrative   Lives with spouse and child Zoey   Caffeine use:  3-4 cups daily (soda/tea)   Social Determinants of Health   Financial Resource Strain:   . Difficulty of Paying Living Expenses: Not on file  Food Insecurity:   . Worried About Programme researcher, broadcasting/film/video in the Last Year: Not on file  . Ran Out of Food in the Last Year: Not on file  Transportation Needs:   . Lack of Transportation (Medical): Not on file  . Lack of Transportation (Non-Medical): Not on file  Physical Activity:   . Days of Exercise per Week: Not on file  . Minutes of Exercise per Session: Not on file  Stress:   . Feeling of Stress : Not on file  Social Connections:   . Frequency of Communication with Friends and Family: Not on file  . Frequency of Social Gatherings with Friends and Family: Not on file  . Attends Religious Services: Not on file  . Active Member of Clubs or Organizations: Not on file  . Attends Banker Meetings: Not on file  . Marital Status: Not on file  Intimate Partner Violence:   . Fear of Current or Ex-Partner: Not on file  . Emotionally Abused: Not on file  . Physically Abused: Not on file  . Sexually Abused: Not on file      PHYSICAL EXAM  There were no vitals filed for this visit. There is no height or weight on file to calculate BMI.   Generalized: Well developed, in no acute distress   Neurological examination  Mentation: Alert oriented to time, place, history taking. Follows all commands speech and language fluent Cranial nerve II-XII: Pupils were equal round reactive to light. Extraocular movements were full,  visual field were full on confrontational test. Facial sensation and strength were normal. Uvula tongue midline. Head turning and shoulder shrug  were normal and symmetric. Motor: The motor testing reveals 5 over 5 strength of all 4 extremities. Good symmetric motor tone is noted throughout.  Sensory: Sensory testing is intact  to soft touch on all 4 extremities. No evidence of extinction is noted.  Coordination: Cerebellar testing reveals good finger-nose-finger and heel-to-shin bilaterally.  Gait and station: Gait is normal.     DIAGNOSTIC DATA (LABS, IMAGING, TESTING) - I reviewed patient records, labs, notes, testing and imaging myself where available.  Lab Results  Component Value Date   WBC 10.1 01/24/2019   HGB 11.6 (L) 01/24/2019   HCT 34.0 (L) 01/24/2019   MCV 86.3 01/24/2019   PLT 187 01/24/2019      Component Value Date/Time   NA 137 01/23/2019 0748   K 3.7 01/23/2019 0748   CL 107 01/23/2019 0748   CO2 19 (L) 01/23/2019 0748   GLUCOSE 81 01/23/2019 0748   BUN 5 (L) 01/23/2019 0748   CREATININE 0.48 01/23/2019 0748   CALCIUM 8.9 01/23/2019 0748   PROT 6.0 (L) 01/23/2019 0748   ALBUMIN 2.7 (L) 01/23/2019 0748   AST 23 01/23/2019 0748   ALT 16 01/23/2019 0748   ALKPHOS 112 01/23/2019 0748   BILITOT 0.7 01/23/2019 0748   GFRNONAA >60 01/23/2019 0748   GFRAA >60 01/23/2019 0748   No results found for: CHOL, HDL, LDLCALC, LDLDIRECT, TRIG, CHOLHDL No results found for: [id number] No results found for: VITAMINB12 No results found for: TSH    ASSESSMENT AND PLAN  29 y.o. year old female  has a past medical history of Anxiety, Arrhythmia, Bell's palsy, Headache, MVP (mitral valve prolapse), and Pregnancy induced hypertension. here with   No diagnosis found.   I spent 20 minutes of face-to-face and non-face-to-face time with patient.  This included previsit chart review, lab review, study review, order entry, electronic health record documentation, patient  education.    Shawnie DapperAmy Staceyann Knouff, MSN, FNP-C 07/29/2020, 9:04 AM  [facility] [address], [address] [place], [place] [id number] [phone]

## 2021-03-11 IMAGING — CT CT HEAD WO/W CM
1 of 2 series · 13 of 30 positions shown, 17 images · IV contrast (iopamidol)
Comparison: CT head 04/27/2018

CLINICAL DATA: Headache and vision issues.

EXAM:
CT HEAD WITHOUT AND WITH CONTRAST
TECHNIQUE: Contiguous axial images were obtained from the base of the skull
through the vertex without and with intravenous contrast
CONTRAST:  75mL OJJRCC-PCC IOPAMIDOL (OJJRCC-PCC) INJECTION 61%

[Series 2: head w/(date) · axial · 0.45mm/px · z∈[-174,-39]mm · 13 of 33 slices shown, 17 images]
[im 3/33  brain]
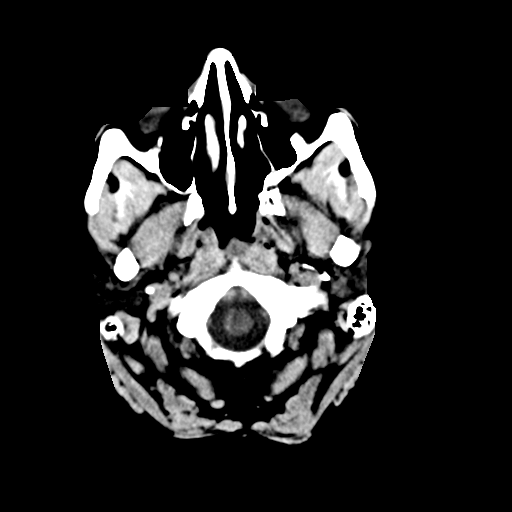
[im 3/33  bone]
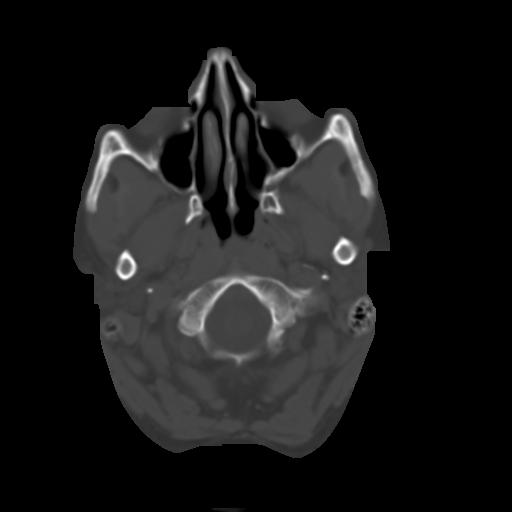
[im 5/33  brain]
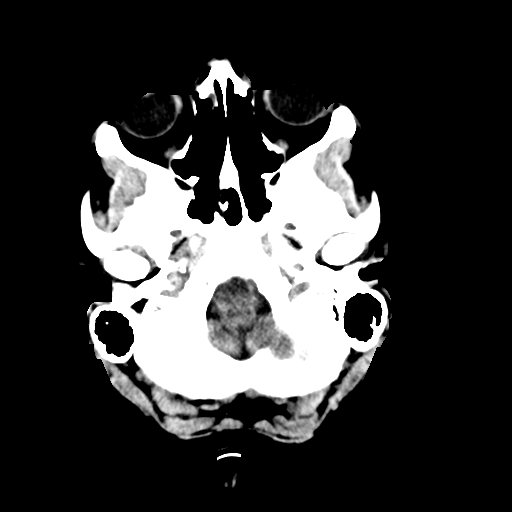
[im 7/33  brain]
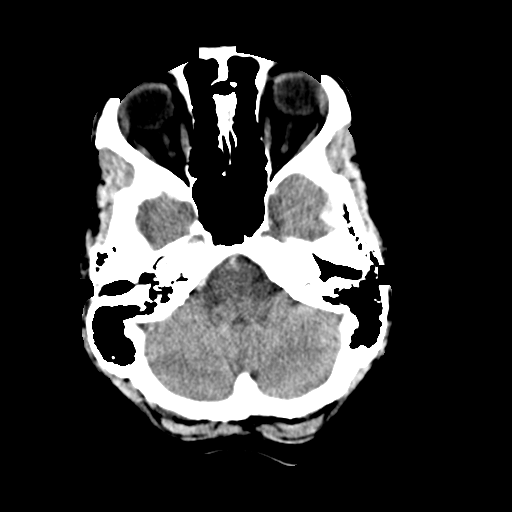
[im 10/33  brain]
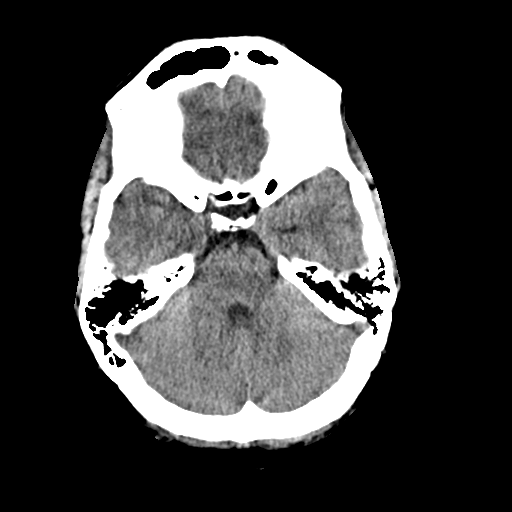
[im 12/33  brain]
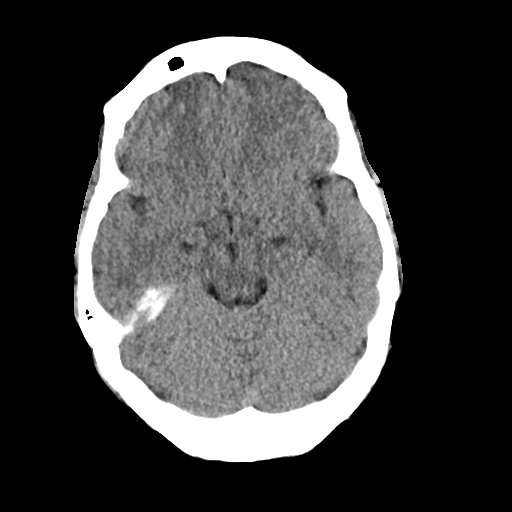
[im 12/33  bone]
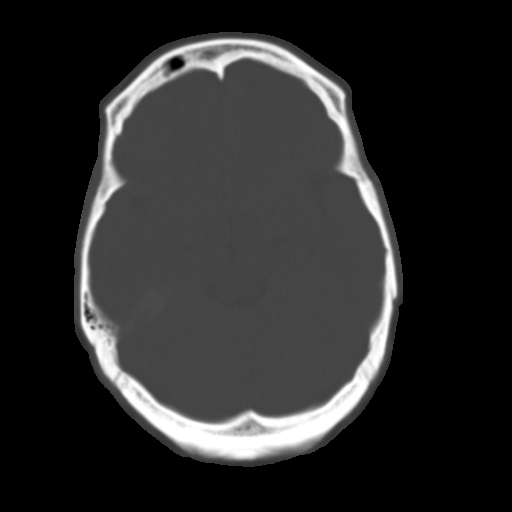
[im 14/33  brain]
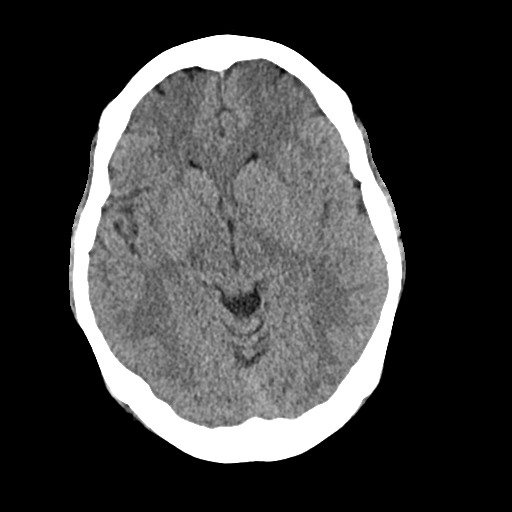
[im 17/33  brain]
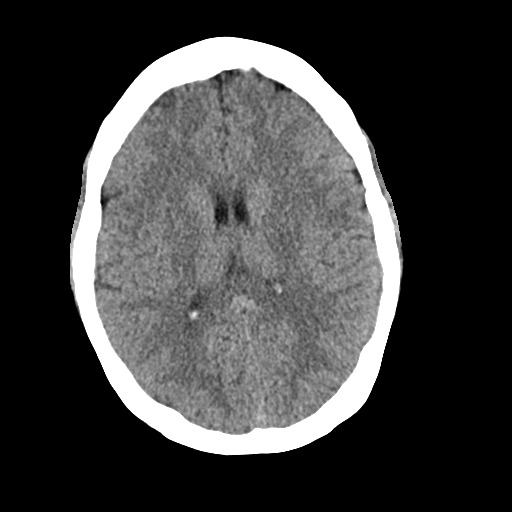
[im 19/33  brain]
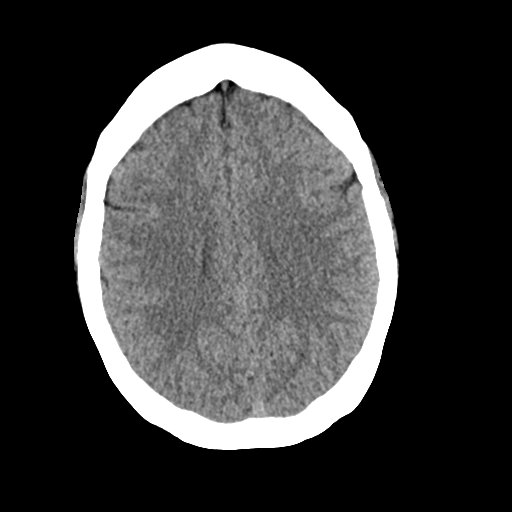
[im 21/33  brain]
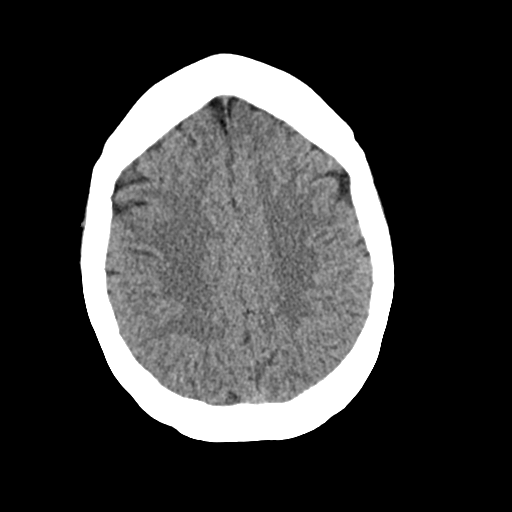
[im 21/33  bone]
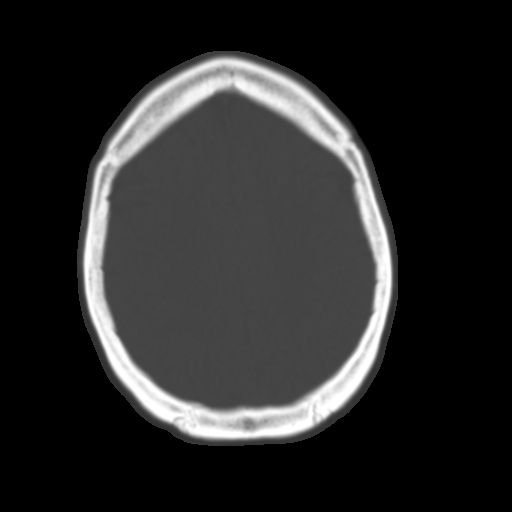
[im 23/33  brain]
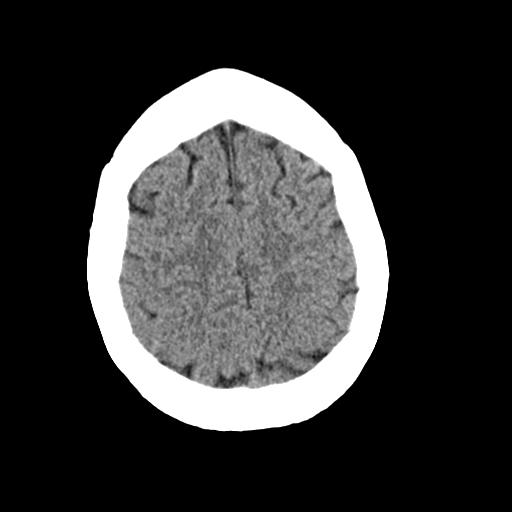
[im 26/33  brain]
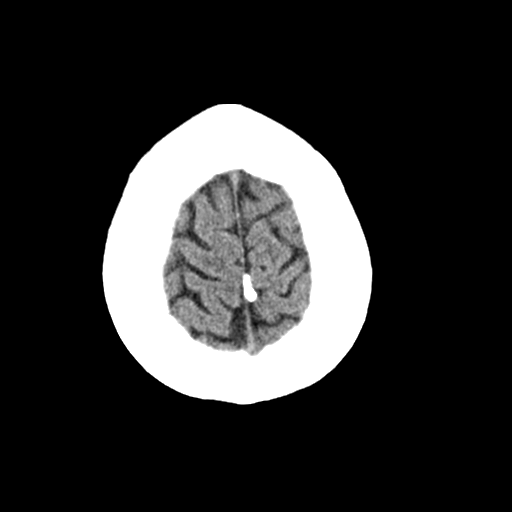
[im 28/33  brain]
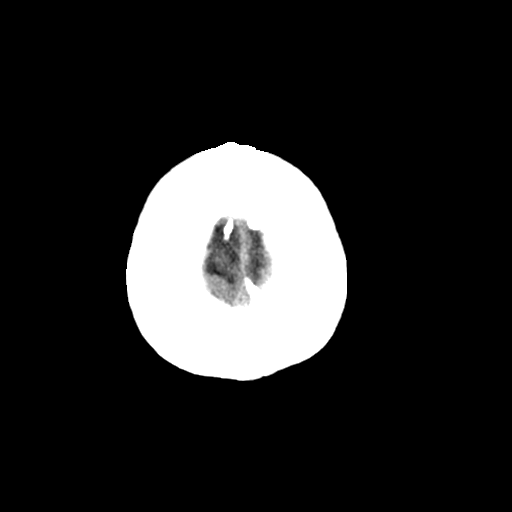
[im 30/33  brain]
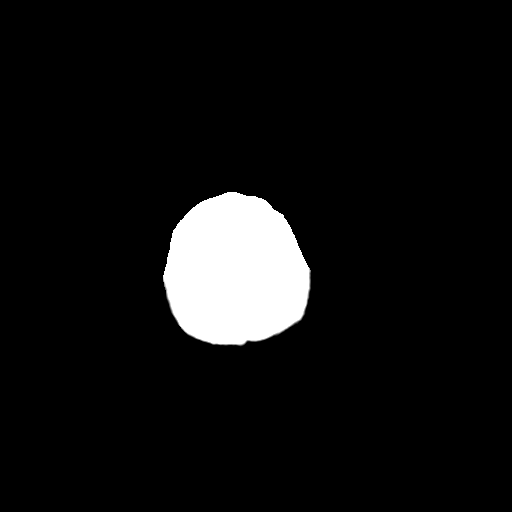
[im 30/33  bone]
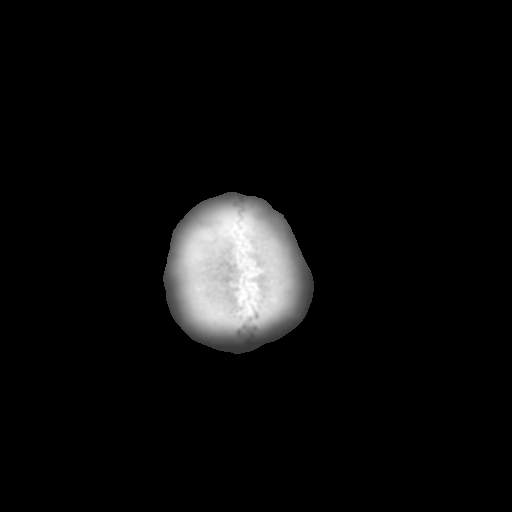

[13 of 30 positions shown; findings below may reference images not displayed]

FINDINGS: Brain: No evidence of acute infarction, hemorrhage, hydrocephalus,
extra-axial collection or mass lesion/mass effect.

Normal enhancement postcontrast administration

Vascular: Negative for hyperdense vessel. Normal vascular
enhancement

Skull: Negative

Sinuses/Orbits: Negative

Other: None
IMPRESSION: Negative CT head with contrast.
